# Patient Record
Sex: Male | Born: 1980 | Hispanic: No | Marital: Married | Smoking: Former smoker
Health system: Southern US, Community
[De-identification: ages and names within clinical notes are randomized; demographics above are authoritative.]

## PROBLEM LIST (undated history)

## (undated) DIAGNOSIS — F1010 Alcohol abuse, uncomplicated: Secondary | ICD-10-CM

## (undated) DIAGNOSIS — R569 Unspecified convulsions: Secondary | ICD-10-CM

---

## 2015-06-17 ENCOUNTER — Encounter (HOSPITAL_COMMUNITY): Payer: Self-pay | Admitting: Emergency Medicine

## 2015-06-17 ENCOUNTER — Inpatient Hospital Stay (HOSPITAL_COMMUNITY)
Admission: EM | Admit: 2015-06-17 | Discharge: 2015-06-22 | DRG: 896 | Disposition: A | Discharge status: Home / Self Care | Payer: Self-pay | Attending: Internal Medicine | Admitting: Internal Medicine

## 2015-06-17 DIAGNOSIS — E876 Hypokalemia: Secondary | ICD-10-CM | POA: Diagnosis present

## 2015-06-17 DIAGNOSIS — R339 Retention of urine, unspecified: Secondary | ICD-10-CM | POA: Diagnosis present

## 2015-06-17 DIAGNOSIS — F10231 Alcohol dependence with withdrawal delirium: Principal | ICD-10-CM | POA: Diagnosis present

## 2015-06-17 DIAGNOSIS — Y900 Blood alcohol level of less than 20 mg/100 ml: Secondary | ICD-10-CM | POA: Diagnosis present

## 2015-06-17 DIAGNOSIS — D7589 Other specified diseases of blood and blood-forming organs: Secondary | ICD-10-CM | POA: Diagnosis present

## 2015-06-17 DIAGNOSIS — F1010 Alcohol abuse, uncomplicated: Secondary | ICD-10-CM | POA: Diagnosis present

## 2015-06-17 DIAGNOSIS — F10239 Alcohol dependence with withdrawal, unspecified: Secondary | ICD-10-CM | POA: Diagnosis present

## 2015-06-17 DIAGNOSIS — R739 Hyperglycemia, unspecified: Secondary | ICD-10-CM | POA: Diagnosis present

## 2015-06-17 DIAGNOSIS — R7301 Impaired fasting glucose: Secondary | ICD-10-CM | POA: Diagnosis present

## 2015-06-17 DIAGNOSIS — T510X1A Toxic effect of ethanol, accidental (unintentional), initial encounter: Secondary | ICD-10-CM | POA: Diagnosis present

## 2015-06-17 DIAGNOSIS — F10931 Alcohol use, unspecified with withdrawal delirium: Secondary | ICD-10-CM

## 2015-06-17 DIAGNOSIS — Z79899 Other long term (current) drug therapy: Secondary | ICD-10-CM

## 2015-06-17 DIAGNOSIS — F10939 Alcohol use, unspecified with withdrawal, unspecified: Secondary | ICD-10-CM | POA: Diagnosis present

## 2015-06-17 DIAGNOSIS — R441 Visual hallucinations: Secondary | ICD-10-CM | POA: Diagnosis present

## 2015-06-17 DIAGNOSIS — F17210 Nicotine dependence, cigarettes, uncomplicated: Secondary | ICD-10-CM | POA: Diagnosis present

## 2015-06-17 DIAGNOSIS — F17200 Nicotine dependence, unspecified, uncomplicated: Secondary | ICD-10-CM | POA: Diagnosis present

## 2015-06-17 DIAGNOSIS — G9340 Encephalopathy, unspecified: Secondary | ICD-10-CM | POA: Diagnosis present

## 2015-06-17 DIAGNOSIS — IMO0001 Reserved for inherently not codable concepts without codable children: Secondary | ICD-10-CM | POA: Diagnosis present

## 2015-06-17 DIAGNOSIS — R030 Elevated blood-pressure reading, without diagnosis of hypertension: Secondary | ICD-10-CM | POA: Diagnosis present

## 2015-06-17 HISTORY — DX: Alcohol abuse, uncomplicated: F10.10

## 2015-06-17 LAB — CBC
HCT: 45.1 % (ref 39.0–52.0)
HEMOGLOBIN: 16.2 g/dL (ref 13.0–17.0)
MCH: 36.2 pg — ABNORMAL HIGH (ref 26.0–34.0)
MCHC: 35.9 g/dL (ref 30.0–36.0)
MCV: 100.9 fL — ABNORMAL HIGH (ref 78.0–100.0)
PLATELETS: 103 10*3/uL — AB (ref 150–400)
RBC: 4.47 MIL/uL (ref 4.22–5.81)
RDW: 12.5 % (ref 11.5–15.5)
WBC: 9.8 10*3/uL (ref 4.0–10.5)

## 2015-06-17 LAB — COMPREHENSIVE METABOLIC PANEL
ALBUMIN: 4.5 g/dL (ref 3.5–5.0)
ALT: 58 U/L (ref 17–63)
ANION GAP: 8 (ref 5–15)
AST: 79 U/L — ABNORMAL HIGH (ref 15–41)
Alkaline Phosphatase: 55 U/L (ref 38–126)
BUN: <5 mg/dL — ABNORMAL LOW (ref 6–20)
CO2: 27 mmol/L (ref 22–32)
Calcium: 9.8 mg/dL (ref 8.9–10.3)
Chloride: 102 mmol/L (ref 101–111)
Creatinine, Ser: 0.73 mg/dL (ref 0.61–1.24)
GFR calc Af Amer: >60 mL/min (ref >60)
GFR calc non Af Amer: >60 mL/min (ref >60)
GLUCOSE: 143 mg/dL — AB (ref 65–99)
POTASSIUM: 3.8 mmol/L (ref 3.5–5.1)
SODIUM: 137 mmol/L (ref 135–145)
Total Bilirubin: 0.9 mg/dL (ref 0.3–1.2)
Total Protein: 8.2 g/dL — ABNORMAL HIGH (ref 6.5–8.1)

## 2015-06-17 LAB — PROTIME-INR
INR: 1.2 (ref 0.00–1.49)
Prothrombin Time: 15.3 seconds — ABNORMAL HIGH (ref 11.6–15.2)

## 2015-06-17 LAB — MRSA PCR SCREENING: MRSA BY PCR: NEGATIVE

## 2015-06-17 LAB — I-STAT CG4 LACTIC ACID, ED
LACTIC ACID, VENOUS: 1.08 mmol/L (ref 0.5–2.0)
Lactic Acid, Venous: 0.73 mmol/L (ref 0.5–2.0)

## 2015-06-17 LAB — ETHANOL: Alcohol, Ethyl (B): <5 mg/dL (ref <5)

## 2015-06-17 MED ORDER — SODIUM CHLORIDE 0.9 % IV SOLN
INTRAVENOUS | Status: DC
  Administered 2015-06-17 – 2015-06-18 (×2): via INTRAVENOUS

## 2015-06-17 MED ORDER — MIDAZOLAM HCL 2 MG/2ML IJ SOLN
INTRAMUSCULAR | Status: AC
  Administered 2015-06-17: 5 mg via INTRAVENOUS
  Filled 2015-06-17: qty 6

## 2015-06-17 MED ORDER — LORAZEPAM 2 MG/ML IJ SOLN
2.0000 mg | Freq: Once | INTRAMUSCULAR | Status: AC
  Administered 2015-06-17: 2 mg via INTRAVENOUS
  Filled 2015-06-17: qty 1

## 2015-06-17 MED ORDER — FOLIC ACID 5 MG/ML IJ SOLN
1.0000 mg | Freq: Every day | INTRAMUSCULAR | Status: DC
  Administered 2015-06-17 – 2015-06-19 (×2): 1 mg via INTRAVENOUS
  Filled 2015-06-17 (×4): qty 0.2

## 2015-06-17 MED ORDER — ENOXAPARIN SODIUM 40 MG/0.4ML ~~LOC~~ SOLN
40.0000 mg | SUBCUTANEOUS | Status: DC
Start: 2015-06-17 — End: 2015-06-22
  Administered 2015-06-17 – 2015-06-21 (×5): 40 mg via SUBCUTANEOUS
  Filled 2015-06-17 (×6): qty 0.4

## 2015-06-17 MED ORDER — LORAZEPAM 1 MG PO TABS: 0.0000 mg | ORAL_TABLET | Freq: Two times a day (BID) | ORAL | Status: DC

## 2015-06-17 MED ORDER — MIDAZOLAM HCL 2 MG/2ML IJ SOLN: 5.0000 mg | Freq: Once | INTRAMUSCULAR | Status: DC

## 2015-06-17 MED ORDER — THIAMINE HCL 100 MG/ML IJ SOLN
100.0000 mg | Freq: Every day | INTRAMUSCULAR | Status: DC
  Administered 2015-06-17 – 2015-06-18 (×2): 100 mg via INTRAVENOUS
  Filled 2015-06-17 (×2): qty 2
  Filled 2015-06-17: qty 1

## 2015-06-17 MED ORDER — LORAZEPAM 2 MG/ML IJ SOLN
0.0000 mg | Freq: Four times a day (QID) | INTRAMUSCULAR | Status: DC
  Administered 2015-06-17: 4 mg via INTRAVENOUS
  Filled 2015-06-17: qty 2

## 2015-06-17 MED ORDER — SODIUM CHLORIDE 0.9 % IV BOLUS (SEPSIS)
1000.0000 mL | Freq: Once | INTRAVENOUS | Status: AC
  Administered 2015-06-17: 1000 mL via INTRAVENOUS

## 2015-06-17 MED ORDER — DIAZEPAM 5 MG/ML IJ SOLN
5.0000 mg | INTRAMUSCULAR | Status: DC | PRN
  Administered 2015-06-17 (×4): 5 mg via INTRAVENOUS
  Administered 2015-06-18 (×4): 10 mg via INTRAVENOUS
  Administered 2015-06-18: 5 mg via INTRAVENOUS
  Administered 2015-06-18 (×2): 10 mg via INTRAVENOUS
  Filled 2015-06-17 (×10): qty 2

## 2015-06-17 MED ORDER — ADULT MULTIVITAMIN W/MINERALS CH
1.0000 | ORAL_TABLET | Freq: Every day | ORAL | Status: DC
  Administered 2015-06-19: 1 via ORAL
  Filled 2015-06-17 (×2): qty 1

## 2015-06-17 MED ORDER — MIDAZOLAM HCL 2 MG/2ML IJ SOLN
5.0000 mg | Freq: Once | INTRAMUSCULAR | Status: AC
  Administered 2015-06-17: 5 mg via INTRAVENOUS

## 2015-06-17 MED ORDER — VITAMIN B-1 100 MG PO TABS
100.0000 mg | ORAL_TABLET | Freq: Every day | ORAL | Status: DC
  Administered 2015-06-19 – 2015-06-22 (×4): 100 mg via ORAL
  Filled 2015-06-17 (×5): qty 1

## 2015-06-17 MED ORDER — DIAZEPAM 5 MG/ML IJ SOLN
5.0000 mg | Freq: Once | INTRAMUSCULAR | Status: DC
  Filled 2015-06-17: qty 2

## 2015-06-17 MED ORDER — LORAZEPAM 2 MG/ML IJ SOLN: 0.0000 mg | Freq: Two times a day (BID) | INTRAMUSCULAR | Status: DC

## 2015-06-17 MED ORDER — LORAZEPAM 1 MG PO TABS: 0.0000 mg | ORAL_TABLET | Freq: Four times a day (QID) | ORAL | Status: DC

## 2015-06-17 NOTE — ED Provider Notes (Signed)
CSN: [id number]     Arrival date & time 06/17/15  1313 History   First MD Initiated Contact with Patient 06/17/15 1345     Chief Complaint  Patient presents with  . Delirium Tremens (DTS)     (Consider location/radiation/quality/duration/timing/severity/associated sxs/prior Treatment) HPI Comments: Stopped drinking recently. Last beer 2 days ago. Wife here states he's been talking to people that aren't here.   Patient is a 34 y.o. male presenting with alcohol problem. The history is provided by the patient.  Alcohol Problem This is a new problem. The current episode started more than 1 week ago. The problem occurs constantly. The problem has not changed since onset.Pertinent negatives include no abdominal pain and no shortness of breath. Nothing aggravates the symptoms. Nothing relieves the symptoms.    Past Medical History  Diagnosis Date  . ETOH abuse    History reviewed. No pertinent past surgical history. History reviewed. No pertinent family history. Social History  Substance Use Topics  . Smoking status: Current Every Day Smoker  . Smokeless tobacco: None  . Alcohol Use: Yes    Review of Systems  Constitutional: Negative for fever.  Respiratory: Negative for cough and shortness of breath.   Gastrointestinal: Negative for vomiting and abdominal pain.  All other systems reviewed and are negative.     Allergies  Review of patient's allergies indicates no known allergies.  Home Medications   Prior to Admission medications   Not on File   BP 161/114 mmHg  Pulse 125  Temp(Src) 99.8 F (37.7 C) (Oral)  Resp 20  SpO2 99% Physical Exam  Constitutional: He is oriented to person, place, and time. He appears well-developed and well-nourished. No distress.  HENT:  Head: Normocephalic and atraumatic.  Mouth/Throat: No oropharyngeal exudate.  Eyes: EOM are normal. Pupils are equal, round, and reactive to light.  Neck: Normal range of motion. Neck supple.   Cardiovascular: Normal rate and regular rhythm.  Exam reveals no friction rub.   No murmur heard. Pulmonary/Chest: Effort normal and breath sounds normal. No respiratory distress. He has no wheezes. He has no rales.  Abdominal: He exhibits no distension. There is no tenderness. There is no rebound.  Musculoskeletal: Normal range of motion. He exhibits no edema.  Neurological: He is alert and oriented to person, place, and time.  Skin: He is not diaphoretic.  Nursing note and vitals reviewed.   ED Course  Procedures (including critical care time) Labs Review Labs Reviewed  CBC  COMPREHENSIVE METABOLIC PANEL  ETHANOL  URINALYSIS, ROUTINE W REFLEX MICROSCOPIC (NOT AT [facility])  PROTIME-INR  I-STAT CG4 LACTIC ACID, ED    Imaging Review No results found. I have personally reviewed and evaluated these images and lab results as part of my medical decision-making.   EKG Interpretation None      MDM   Final diagnoses:  Delirium tremens    34 year old male here for concern of delirium tremens. He has a chronic drinker and has tapered off his drinking over the past 4 days. He is now having auditory and visual hallucinations. In triage she was talking to his pants and other people in the room. He tremulous, tachycardic. We'll place on CIWA protocol and anticipate admission for his delirium tremens.    Elwin Mocha, MD 06/17/15 [phone]

## 2015-06-17 NOTE — ED Notes (Signed)
Attempted report x 2 

## 2015-06-17 NOTE — ED Notes (Signed)
Per family pt quit drinking ETOH on Saturday and now having auditory and visual hallucinations; pt is speaking to his pants currently and twitching and scratching at legs; pt unable to follow commands at present due to distraction; pt with hx of drinking 18-24 beers a day
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
## 2015-06-17 NOTE — ED Notes (Signed)
Pt is now sleeping with even and unlabored respirations.  Pt remains on NRB.

## 2015-06-17 NOTE — ED Notes (Signed)
Patient becoming more agitated and restless.  Pulling lines, actively hallucinating.  Patient becoming more difficult to redirect.  Dr. Gwendolyn Grant notified, see orders for versed.

## 2015-06-17 NOTE — ED Notes (Signed)
Attempted to call report to floor. RN unavailable and will call back. 

## 2015-06-18 DIAGNOSIS — F10231 Alcohol dependence with withdrawal delirium: Principal | ICD-10-CM

## 2015-06-18 DIAGNOSIS — E876 Hypokalemia: Secondary | ICD-10-CM

## 2015-06-18 LAB — BASIC METABOLIC PANEL
Anion gap: 8 (ref 5–15)
BUN: <5 mg/dL — ABNORMAL LOW (ref 6–20)
CHLORIDE: 102 mmol/L (ref 101–111)
CO2: 28 mmol/L (ref 22–32)
CREATININE: 0.64 mg/dL (ref 0.61–1.24)
Calcium: 8.8 mg/dL — ABNORMAL LOW (ref 8.9–10.3)
GFR calc non Af Amer: >60 mL/min (ref >60)
GLUCOSE: 98 mg/dL (ref 65–99)
Potassium: 3.2 mmol/L — ABNORMAL LOW (ref 3.5–5.1)
Sodium: 138 mmol/L (ref 135–145)

## 2015-06-18 LAB — CBC
HCT: 41.5 % (ref 39.0–52.0)
Hemoglobin: 14.8 g/dL (ref 13.0–17.0)
MCH: 36 pg — AB (ref 26.0–34.0)
MCHC: 35.7 g/dL (ref 30.0–36.0)
MCV: 101 fL — AB (ref 78.0–100.0)
Platelets: 95 10*3/uL — ABNORMAL LOW (ref 150–400)
RBC: 4.11 MIL/uL — AB (ref 4.22–5.81)
RDW: 12.5 % (ref 11.5–15.5)
WBC: 5.9 10*3/uL (ref 4.0–10.5)

## 2015-06-18 LAB — URINALYSIS, ROUTINE W REFLEX MICROSCOPIC
BILIRUBIN URINE: NEGATIVE
Glucose, UA: NEGATIVE mg/dL
Hgb urine dipstick: NEGATIVE
KETONES UR: 40 mg/dL — AB
LEUKOCYTES UA: NEGATIVE
NITRITE: NEGATIVE
PH: 7.5 (ref 5.0–8.0)
Protein, ur: NEGATIVE mg/dL
SPECIFIC GRAVITY, URINE: 1.018 (ref 1.005–1.030)
UROBILINOGEN UA: 1 mg/dL (ref 0.0–1.0)

## 2015-06-18 MED ORDER — MIDAZOLAM HCL 2 MG/2ML IJ SOLN: 1.0000 mg | INTRAMUSCULAR | Status: DC | PRN

## 2015-06-18 MED ORDER — PNEUMOCOCCAL VAC POLYVALENT 25 MCG/0.5ML IJ INJ
0.5000 mL | INJECTION | INTRAMUSCULAR | Status: AC
  Administered 2015-06-19: 0.5 mL via INTRAMUSCULAR
  Filled 2015-06-18: qty 0.5

## 2015-06-18 MED ORDER — HYDRALAZINE HCL 20 MG/ML IJ SOLN
10.0000 mg | INTRAMUSCULAR | Status: DC | PRN
  Administered 2015-06-18: 10 mg via INTRAVENOUS
  Filled 2015-06-18: qty 1

## 2015-06-18 MED ORDER — DIAZEPAM 5 MG/ML IJ SOLN
15.0000 mg | Freq: Once | INTRAMUSCULAR | Status: AC
  Administered 2015-06-18: 15 mg via INTRAVENOUS
  Filled 2015-06-18: qty 4

## 2015-06-18 MED ORDER — DEXMEDETOMIDINE HCL IN NACL 200 MCG/50ML IV SOLN
0.2000 ug/kg/h | INTRAVENOUS | Status: AC
Start: 2015-06-18 — End: 2015-06-19
  Administered 2015-06-18: 0.2 ug/kg/h via INTRAVENOUS
  Administered 2015-06-18: 0.6 ug/kg/h via INTRAVENOUS
  Administered 2015-06-18: 0.7 ug/kg/h via INTRAVENOUS
  Administered 2015-06-18: 0.8 ug/kg/h via INTRAVENOUS
  Administered 2015-06-19: 0.5 ug/kg/h via INTRAVENOUS
  Filled 2015-06-18 (×6): qty 50

## 2015-06-18 MED ORDER — DIAZEPAM 5 MG/ML IJ SOLN
5.0000 mg | Freq: Once | INTRAMUSCULAR | Status: AC
  Administered 2015-06-18: 5 mg via INTRAVENOUS
  Filled 2015-06-18: qty 2

## 2015-06-18 MED ORDER — DEXTROSE IN LACTATED RINGERS 5 % IV SOLN
INTRAVENOUS | Status: DC
  Administered 2015-06-18: 16:00:00 via INTRAVENOUS
  Administered 2015-06-19: 1000 mL via INTRAVENOUS
  Administered 2015-06-19 – 2015-06-20 (×2): via INTRAVENOUS

## 2015-06-18 NOTE — Progress Notes (Signed)
eLink Physician-Brief Progress Note Patient Name: Ryan Greer DOB: [birth year] MRN: [id number]   Date of Service  06/18/2015  HPI/Events of Note  htn Hr 50  eICU Interventions  hyral May add clonidine patch     Intervention Category Intermediate Interventions: Hypertension - evaluation and management  Nelda Bucks. 06/18/2015, 5:58 PM

## 2015-06-18 NOTE — Care Management Note (Signed)
Case Management Note  Patient Details  Name: Ryan Greer MRN: [id number] Date of Birth: [birth year]  Subjective/Objective:    DT's - CWA 26.  Placed consult for ETOH counseling when able.                 Action/Plan:   Expected Discharge Date:                  Expected Discharge Plan:  Home/Self Care  In-House Referral:  Clinical Social Work  Discharge planning Services  CM Consult  Post Acute Care Choice:    Choice offered to:     DME Arranged:    DME Agency:     HH Arranged:    HH Agency:     Status of Service:  In process, will continue to follow  Medicare Important Message Given:    Date Medicare IM Given:    Medicare IM give by:    Date Additional Medicare IM Given:    Additional Medicare Important Message give by:     If discussed at Long Length of Stay Meetings, dates discussed:    Additional Comments:  Vangie Bicker, RN 06/18/2015, 10:46 AM

## 2015-06-18 NOTE — Progress Notes (Signed)
Utilization Review Completed.Dowell, Deborah T9/30/2016  

## 2015-06-18 NOTE — Progress Notes (Signed)
Pt's CIWAs consistently in 41s, notified IMTS resident; MD to call back w/ potential transfer orders for precedex drip. Will continue to monitor.

## 2015-06-18 NOTE — Progress Notes (Signed)
Subjective: Patient not well controlled on CIWA protocol with  valium given since 9/29 evening. Continued with tremors, hallucinations, and paradoxical sweats. PCCM was consulted and patient transferred for treatment with precedex drip.  Objective: Vital signs in last 24 hours: Filed Vitals:   06/18/15 1300 06/18/15 1418 06/18/15 1423 06/18/15 1500  BP: 145/104 122/86  164/95  Pulse: 77   63  Temp:  97.9 F (36.6 C)    TempSrc:  Axillary    Resp: Height:      Weight:      SpO2: 97% 90% 94% 92%   Weight change:   Intake/Output Summary (Last 24 hours) at 06/18/15 1615 Last data filed at 06/18/15 1323  Gross per 24 hour  Intake 791.07 ml  Output   1650 ml  Net -858.93 ml   GENERAL- alert, co-operative, NAD HEENT- Atraumatic, PERRL, oral mucosa appears moist CARDIAC- RRR, no murmurs, rubs or gallops. RESP- CTAB, no wheezes or crackles. ABDOMEN- Soft, nontender, no guarding or rebound, normoactive bowel sounds present PSYCH- patient very agitated, disoriented, intermittent gesturing towards empty area of the room  Lab Results: Basic Metabolic Panel:  Recent Labs Lab 06/17/15 1404 06/18/15 0335  NA 137 138  K 3.8 3.2*  CL 102 102  CO2 27 28  GLUCOSE 143* 98  BUN <5* <5*  CREATININE 0.73 0.64  CALCIUM 9.8 8.8*   Liver Function Tests:  Recent Labs Lab 06/17/15 1404  AST 79*  ALT 58  ALKPHOS 55  BILITOT 0.9  PROT 8.2*  ALBUMIN 4.5   No results for input(s): LIPASE, AMYLASE in the last 168 hours. No results for input(s): AMMONIA in the last 168 hours. CBC:  Recent Labs Lab 06/17/15 1404 06/18/15 0335  WBC 9.8 5.9  HGB 16.2 14.8  HCT 45.1 41.5  MCV 100.9* 101.0*  PLT 103* 95*   Cardiac Enzymes: No results for input(s): CKTOTAL, CKMB, CKMBINDEX, TROPONINI in the last 168 hours. BNP: No results for input(s): PROBNP in the last 168 hours. D-Dimer: No results for input(s): DDIMER in the last 168 hours. CBG: No results for input(s):  GLUCAP in the last 168 hours. Hemoglobin A1C: No results for input(s): HGBA1C in the last 168 hours. Fasting Lipid Panel: No results for input(s): CHOL, HDL, LDLCALC, TRIG, CHOLHDL, LDLDIRECT in the last 168 hours. Thyroid Function Tests: No results for input(s): TSH, T4TOTAL, FREET4, T3FREE, THYROIDAB in the last 168 hours. Coagulation:  Recent Labs Lab 06/17/15 1404  LABPROT 15.3*  INR 1.20   Anemia Panel: No results for input(s): VITAMINB12, FOLATE, FERRITIN, TIBC, IRON, RETICCTPCT in the last 168 hours. Urine Drug Screen: Drugs of Abuse  No results found for: LABOPIA, COCAINSCRNUR, LABBENZ, AMPHETMU, THCU, LABBARB  Alcohol Level:  Recent Labs Lab 06/17/15 1404  ETH <5   Urinalysis:  Recent Labs Lab 06/18/15 1206  COLORURINE YELLOW  LABSPEC 1.018  PHURINE 7.5  GLUCOSEU NEGATIVE  HGBUR NEGATIVE  BILIRUBINUR NEGATIVE  KETONESUR 40*  PROTEINUR NEGATIVE  UROBILINOGEN 1.0  NITRITE NEGATIVE  LEUKOCYTESUR NEGATIVE    Micro Results: Recent Results (from the past 240 hour(s))  MRSA PCR Screening     Status: None   Collection Time: 06/17/15  5:59 PM  Result Value Ref Range Status   MRSA by PCR NEGATIVE NEGATIVE Final    Comment:        The GeneXpert MRSA Assay (FDA approved for NASAL specimens only), is one component of a comprehensive MRSA colonization surveillance program. It is not  intended to diagnose MRSA infection nor to guide or monitor treatment for MRSA infections.    Studies/Results: No results found. Medications: I have reviewed the patient's current medications. Scheduled Meds: . enoxaparin (LOVENOX) injection  40 mg Subcutaneous Q24H  . folic acid  1 mg Intravenous Daily  . multivitamin with minerals  1 tablet Oral Daily  . [START ON 06/19/2015] pneumococcal 23 valent vaccine  0.5 mL Intramuscular Tomorrow-1000  . thiamine  100 mg Oral Daily   Or  . thiamine  100 mg Intravenous Daily   Continuous Infusions: . dexmedetomidine 0.8  mcg/kg/hr (06/18/15 1512)  . dextrose 5% lactated ringers 75 mL/hr at 06/18/15 1542   PRN Meds:.midazolam Assessment/Plan: Delirium tremens Symptoms not controlled on high dose of valium. Did not tolerate ativan, had no improvement in ED. Plan today is transfer to [facility] service as he is not adequately treated on CIWA protocol in stepdown unit.. - CIWA protocol with diazepam IV when necessary - Multivitamin, Thiamine, Folic acid - cardiac monitoring - Transfer to ICU  Diet: NPO DVT ppx: [place] lovenox FULL CODE  Dispo: Disposition is deferred at this time, awaiting improvement of current medical problems.    LOS: 1 day   Fuller Plan, MD 06/18/2015, 4:15 PM

## 2015-06-18 NOTE — Progress Notes (Signed)
Pt increasingly more agitated, trying to crawl out of bed. MD notified, PRN meds given. Will continue to monitor.
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
## 2015-06-18 NOTE — Progress Notes (Signed)
Pt continues to pull at equipment and attempt to get out of bed. Attempted to reorient patient throughout shift without success. Telephone order received for posey belt. Will apply and continue to monitor.
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
## 2015-06-18 NOTE — Progress Notes (Signed)
MD to bedside, eLink notified of pt's increased agitation. Pt tachycardic and tachypnic; VS return to [facility] and agitation decreased. eLink aware and will keep watch; no transfer orders at this time. Will continue to monitor pt.

## 2015-06-19 LAB — MAGNESIUM: MAGNESIUM: 1.8 mg/dL (ref 1.7–2.4)

## 2015-06-19 LAB — PHOSPHORUS: Phosphorus: 4 mg/dL (ref 2.5–4.6)

## 2015-06-19 MED ORDER — DEXMEDETOMIDINE HCL IN NACL 200 MCG/50ML IV SOLN
0.2000 ug/kg/h | INTRAVENOUS | Status: DC
  Administered 2015-06-19: 0.4 ug/kg/h via INTRAVENOUS
  Administered 2015-06-20: 0.3 ug/kg/h via INTRAVENOUS
  Filled 2015-06-19 (×2): qty 50

## 2015-06-19 NOTE — Progress Notes (Signed)
eLink Physician-Brief Progress Note Patient Name: Ryan Greer DOB: 1980-10-15 MRN: [id number]   Date of Service  06/19/2015  HPI/Events of Note  Urinary retention with greater than 1 liter in bladder on scan.  eICU Interventions  Foley to straight drain     Intervention Category Intermediate Interventions: Other:  DETERDING,ELIZABETH 06/19/2015, 2:15 AM
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
## 2015-06-20 LAB — MAGNESIUM: Magnesium: 1.7 mg/dL (ref 1.7–2.4)

## 2015-06-20 LAB — PHOSPHORUS: Phosphorus: 4.6 mg/dL (ref 2.5–4.6)

## 2015-06-20 MED ORDER — THIAMINE HCL 100 MG/ML IJ SOLN: 100.0000 mg | Freq: Every day | INTRAMUSCULAR | Status: DC

## 2015-06-20 MED ORDER — FOLIC ACID 1 MG PO TABS
1.0000 mg | ORAL_TABLET | Freq: Every day | ORAL | Status: DC
  Administered 2015-06-20 – 2015-06-22 (×3): 1 mg via ORAL
  Filled 2015-06-20 (×3): qty 1

## 2015-06-20 MED ORDER — LORAZEPAM 2 MG/ML IJ SOLN
1.0000 mg | Freq: Four times a day (QID) | INTRAMUSCULAR | Status: DC | PRN
  Administered 2015-06-20: 1 mg via INTRAVENOUS
  Filled 2015-06-20: qty 1

## 2015-06-20 MED ORDER — LORAZEPAM 1 MG PO TABS: 1.0000 mg | ORAL_TABLET | Freq: Four times a day (QID) | ORAL | Status: DC | PRN

## 2015-06-20 MED ORDER — VITAMIN B-1 100 MG PO TABS: 100.0000 mg | ORAL_TABLET | Freq: Every day | ORAL | Status: DC

## 2015-06-20 MED ORDER — ADULT MULTIVITAMIN W/MINERALS CH
1.0000 | ORAL_TABLET | Freq: Every day | ORAL | Status: DC
  Administered 2015-06-20 – 2015-06-22 (×3): 1 via ORAL
  Filled 2015-06-20 (×3): qty 1

## 2015-06-20 MED ORDER — FOLIC ACID 1 MG PO TABS
1.0000 mg | ORAL_TABLET | Freq: Every day | ORAL | Status: DC
Start: 2015-06-20 — End: 2015-06-20

## 2015-06-20 MED ORDER — MAGNESIUM SULFATE 2 GM/50ML IV SOLN
2.0000 g | Freq: Once | INTRAVENOUS | Status: AC
  Administered 2015-06-20: 2 g via INTRAVENOUS
  Filled 2015-06-20: qty 50
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
## 2015-06-21 DIAGNOSIS — E876 Hypokalemia: Secondary | ICD-10-CM | POA: Diagnosis present

## 2015-06-21 DIAGNOSIS — R030 Elevated blood-pressure reading, without diagnosis of hypertension: Secondary | ICD-10-CM

## 2015-06-21 DIAGNOSIS — IMO0001 Reserved for inherently not codable concepts without codable children: Secondary | ICD-10-CM | POA: Diagnosis present

## 2015-06-21 DIAGNOSIS — R7301 Impaired fasting glucose: Secondary | ICD-10-CM

## 2015-06-21 LAB — GLUCOSE, CAPILLARY
GLUCOSE-CAPILLARY: 111 mg/dL — AB (ref 65–99)
Glucose-Capillary: 93 mg/dL (ref 65–99)
Glucose-Capillary: 138 mg/dL — ABNORMAL HIGH (ref 65–99)

## 2015-06-21 LAB — COMPREHENSIVE METABOLIC PANEL
ALBUMIN: 3.5 g/dL (ref 3.5–5.0)
ALK PHOS: 41 U/L (ref 38–126)
ALT: 33 U/L (ref 17–63)
AST: 24 U/L (ref 15–41)
Anion gap: 9 (ref 5–15)
BILIRUBIN TOTAL: 0.7 mg/dL (ref 0.3–1.2)
CALCIUM: 9.2 mg/dL (ref 8.9–10.3)
CO2: 29 mmol/L (ref 22–32)
Chloride: 101 mmol/L (ref 101–111)
Creatinine, Ser: 0.57 mg/dL — ABNORMAL LOW (ref 0.61–1.24)
GFR calc Af Amer: >60 mL/min (ref >60)
GFR calc non Af Amer: >60 mL/min (ref >60)
GLUCOSE: 110 mg/dL — AB (ref 65–99)
Potassium: 3.4 mmol/L — ABNORMAL LOW (ref 3.5–5.1)
SODIUM: 139 mmol/L (ref 135–145)
TOTAL PROTEIN: 6.2 g/dL — AB (ref 6.5–8.1)

## 2015-06-21 LAB — CBC WITH DIFFERENTIAL/PLATELET
BASOS PCT: 1 %
Basophils Absolute: 0.1 10*3/uL (ref 0.0–0.1)
EOS ABS: 0.1 10*3/uL (ref 0.0–0.7)
EOS PCT: 2 %
HEMATOCRIT: 42.4 % (ref 39.0–52.0)
Hemoglobin: 15.4 g/dL (ref 13.0–17.0)
Lymphocytes Relative: 21 %
Lymphs Abs: 1.3 10*3/uL (ref 0.7–4.0)
MCH: 36 pg — ABNORMAL HIGH (ref 26.0–34.0)
MCHC: 36.3 g/dL — AB (ref 30.0–36.0)
MCV: 99.1 fL (ref 78.0–100.0)
MONO ABS: 1.5 10*3/uL — AB (ref 0.1–1.0)
MONOS PCT: 25 %
Neutro Abs: 3.1 10*3/uL (ref 1.7–7.7)
Neutrophils Relative %: 51 %
PLATELETS: 154 10*3/uL (ref 150–400)
RBC: 4.28 MIL/uL (ref 4.22–5.81)
RDW: 12.2 % (ref 11.5–15.5)
WBC: 6.1 10*3/uL (ref 4.0–10.5)

## 2015-06-21 LAB — MAGNESIUM: MAGNESIUM: 1.9 mg/dL (ref 1.7–2.4)

## 2015-06-21 MED ORDER — POTASSIUM CHLORIDE CRYS ER 20 MEQ PO TBCR
40.0000 meq | EXTENDED_RELEASE_TABLET | Freq: Once | ORAL | Status: AC
  Administered 2015-06-21: 40 meq via ORAL
  Filled 2015-06-21: qty 2

## 2015-06-21 NOTE — Progress Notes (Signed)
TRANSFER ACCEPTING NOTE  Ryan Greer is 34 year old man with past medical history of alcohol abuse (20 beers daily for at least the past 3 years) who presented on 06/17/15 to IMTS with delirium tremens and witnessed LOC per wife after not drinking alcohol for three days prior to admission. Ethanol level was undetectable on admission. He was placed on CIWA protocol with valium however due to consistently high CIWA's in the 20's requiring total 90 mg of valium and persistent agitation with tachycardia and tachypnea he required transfer to ICU on 9/30 for administration of precedex drip which he received until 10/2 with resolution of delirium tremens and agitation.    Pt seen and examined this morning. No acute events overnight. He denies any complaints.    Objective: Vital signs in last 24 hours: Filed Vitals:   06/20/15 1202 06/20/15 1419 06/20/15 2100 06/21/15 0500  BP:  138/85 156/99 157/101  Pulse:  89 107 86  Temp: 98.7 F (37.1 C) 99.3 F (37.4 C) 98.5 F (36.9 C) 98.6 F (37 C)  TempSrc: Oral Oral Oral Oral  Resp:  Height:      Weight:   166 lb 12.8 oz (75.66 kg)   SpO2:  100% 100% 100%   Weight change:   Intake/Output Summary (Last 24 hours) at 06/21/15 1610 Last data filed at 06/20/15 1200  Gross per 24 hour  Intake    770 ml  Output   1075 ml  Net   -305 ml   PHYSICAL EXAMINATION:  General: NAD, resting in bed Heart: Normal rate and rhythm with no murmur  Lungs: Clear to ausculation bilaterally,  no ronchi, rales, or wheezing.  Abdomen: Soft, non-tender, non-distended, normal BS Extremities: No edema  Neuro: A & O x 3, no tremors    Lab Results: Basic Metabolic Panel:  Recent Labs Lab 06/18/15 0335  06/19/15 1105 06/20/15 0208 06/21/15 0455  NA 138  --   --   --  139  K 3.2*  --   --   --  3.4*  CL 102  --   --   --  101  CO2 28  --   --   --  29  GLUCOSE 98  --   --   --  110*  BUN <5*  --   --    --  <5*  CREATININE 0.64  --   --   --  0.57*  CALCIUM 8.8*  --   --   --  9.2  MG  --   < > 1.8 1.7 1.9  PHOS  --   --  4.0 4.6  --   < > = values in this interval not displayed. Liver Function Tests:  Recent Labs Lab 06/17/15 1404 06/21/15 0455  AST 79* 24  ALT 58 33  ALKPHOS 55 41  BILITOT 0.9 0.7  PROT 8.2* 6.2*  ALBUMIN 4.5 3.5   CBC:  Recent Labs Lab 06/18/15 0335 06/21/15 0455  WBC 5.9 6.1  NEUTROABS  --  3.1  HGB 14.8 15.4  HCT 41.5 42.4  MCV 101.0* 99.1  PLT 95* 154  Coagulation:  Recent Labs Lab 06/17/15 1404  LABPROT 15.3*  INR 1.20   Alcohol Level:  Recent Labs Lab 06/17/15 1404  ETH <5   Urinalysis:  Recent Labs Lab 06/18/15 1206  COLORURINE YELLOW  LABSPEC 1.018  PHURINE 7.5  GLUCOSEU NEGATIVE  HGBUR NEGATIVE  BILIRUBINUR NEGATIVE  KETONESUR 40*  PROTEINUR NEGATIVE  UROBILINOGEN 1.0  NITRITE NEGATIVE  LEUKOCYTESUR NEGATIVE    Micro Results: Recent Results (from the past 240 hour(s))  MRSA PCR Screening     Status: None   Collection Time: 06/17/15  5:59 PM  Result Value Ref Range Status   MRSA by PCR NEGATIVE NEGATIVE Final    Comment:        The GeneXpert MRSA Assay (FDA approved for NASAL specimens only), is one component of a comprehensive MRSA colonization surveillance program. It is not intended to diagnose MRSA infection nor to guide or monitor treatment for MRSA infections.    Studies/Results: No results found. Medications: I have reviewed the patient's current medications. Scheduled Meds: . enoxaparin (LOVENOX) injection  40 mg Subcutaneous Q24H  . folic acid  1 mg Oral Daily  . multivitamin with minerals  1 tablet Oral Daily  . thiamine  100 mg Oral Daily   Continuous Infusions:  PRN Meds:.LORazepam **OR** LORazepam Assessment/Plan:  Delirium tremens in setting of alcohol abuse - Resolved. Pt with CIWA score overnight of 0-1 requiring 1 mg of Ativan administration last night. -Continue CIWA protocol    -Continue thiamine 100 mg daily, folic acid 1 mg daily, and multivitamin daily  -Pt counseled on alcohol cessation  -Consult SW and CM   Hypokalemia - K 3.4 this AM with Mag 1.9 in setting of alcohol abuse and decreased dietary intake.  -Replete with kdur 40 mEq x 1  -Monitor BMP and Mg   Elevated Blood Pressure - BP 157/101 with no prior history of hypertension.  -BP not at goal <140/90  -Continue to monitor   Elevated fasting blood sugar - BG 110 this AM with no prior history of DM.  -Consider obtaining A1c if fasting BG >126    HIV Screening - Pt with no prior screening. -Follow HV Ab   Diet: Regular DVT Ppx: Lovenox   Code: Full   Dispo: Disposition is deferred at this time, awaiting improvement of current medical problems.  Anticipated discharge in approximately 0-1 day(s).   The patient does not have a current PCP (No primary care provider on file.) and does need an [facility] hospital follow-up appointment after discharge.  The patient does not have transportation limitations that hinder transportation to clinic appointments.  .Services Needed at time of discharge: Y = Yes, Blank = No PT:   OT:   RN:   Equipment:   Other:     LOS: 4 days   Otis Brace, MD 06/21/2015, 7:28 AM

## 2015-06-21 NOTE — Care Management Note (Signed)
Case Management Note  Patient Details  Name: Ryan Greer MRN: [id number] Date of Birth: December 20, 1980  Subjective/Objective:      CM following for progression and d/c planning.              Action/Plan: 06/21/2015 Noted that pt needs PCP, also noted that pt has [place] address, eligibility appointment scheduled at [facility] for  Tuesday, Jun 29, 2015 2 10:30am. Will assist with meds if appropriate. Expected Discharge Date:    06/22/2015              Expected Discharge Plan:  Home/Self Care  In-House Referral:  Clinical Social Work  Discharge planning Services  CM Consult, Indigent Health Clinic  Post Acute Care Choice:  NA Choice offered to:     DME Arranged:    DME Agency:     HH Arranged:    HH Agency:     Status of Service:  Completed, signed off  Medicare Important Message Given:    Date Medicare IM Given:    Medicare IM give by:    Date Additional Medicare IM Given:    Additional Medicare Important Message give by:     If discussed at Long Length of Stay Meetings, dates discussed:    Additional Comments:  Starlyn Skeans, RN 06/21/2015, 4:05 PM

## 2015-06-21 NOTE — Evaluation (Signed)
Physical Therapy Evaluation Patient Details Name: Ryan Greer MRN: [id number] DOB: 1981/02/11 Today's Date: 06/21/2015   History of Present Illness  [place] Francisco is 34 year old man with past medical history of alcohol abuse (20 beers daily for at least the past 3 years) who presented on 06/17/15 to IMTS with delirium tremens and witnessed LOC per wife after not drinking alcohol for three days prior to admission. Ethanol level was undetectable on admission. He was placed on CIWA protocol with valium however due to consistently high CIWA's in the 20's requiring total 90 mg of valium and persistent agitation with tachycardia and tachypnea he required transfer to ICU on 9/30 for administration of precedex drip which he received until 10/2 with resolution of delirium tremens and agitation  Clinical Impression   Patient evaluated by Physical Therapy with no further acute PT needs identified. All education has been completed and the patient has no further questions.  . PT is signing off. Thank you for this referral.     Follow Up Recommendations No PT follow up    Equipment Recommendations  None recommended by PT    Recommendations for Other Services       Precautions / Restrictions Precautions Precautions: None      Mobility  Bed Mobility Overal bed mobility: Independent                Transfers Overall transfer level: Independent                  Ambulation/Gait Ambulation/Gait assistance: Independent   Assistive device: None Gait Pattern/deviations: WFL(Within Functional Limits)     General Gait Details: Independent  Stairs            Wheelchair Mobility    Modified Rankin (Stroke Patients Only)       Balance Overall balance assessment: No apparent balance deficits (not formally assessed)                                           Pertinent Vitals/Pain Pain Assessment: No/denies pain    Home Living Family/patient expects to  be discharged to:: Private residence Living Arrangements: Spouse/significant other Available Help at Discharge: Family Type of Home: House Home Access: Stairs to enter   Secretary/administrator of Steps: 4 Home Layout: One level        Prior Function Level of Independence: Independent         Comments: works in [employer]        Extremity/Trunk Assessment   Upper Extremity Assessment: Overall WFL for tasks assessed           Lower Extremity Assessment: Overall WFL for tasks assessed         Communication   Communication: Prefers language other than English ([place])  Cognition Arousal/Alertness: Awake/alert Behavior During Therapy: WFL for tasks assessed/performed Overall Cognitive Status: Within Functional Limits for tasks assessed                      General Comments General comments (skin integrity, edema, etc.): We discussed AA meeting in [facility]; he plans to look up meetings in Spanish    Exercises        Assessment/Plan    PT Assessment Patent does not need any further PT services  PT Diagnosis Generalized weakness   PT Problem List    PT  Treatment Interventions     PT Goals (Current goals can be found in the Care Plan section) Acute Rehab PT Goals Patient Stated Goal: Plans to attend AA meetings PT Goal Formulation: All assessment and education complete, DC therapy    Frequency     Barriers to discharge        Co-evaluation               End of Session   Activity Tolerance: Patient tolerated treatment well Patient left: Other (comment) (managing independently in hallway) Nurse Communication: Mobility status         Time: 1610-9604 PT Time Calculation (min) (ACUTE ONLY): 22 min   Charges:   PT Evaluation $Initial PT Evaluation Tier I: 1 Procedure     PT G CodesVan Clines Hamff 06/21/2015, 10:56 AM  Van Clines, PT  Acute Rehabilitation Services Pager  [phone] Office [phone]

## 2015-06-22 LAB — BASIC METABOLIC PANEL
ANION GAP: 8 (ref 5–15)
BUN: 8 mg/dL (ref 6–20)
CALCIUM: 9.1 mg/dL (ref 8.9–10.3)
CO2: 28 mmol/L (ref 22–32)
Chloride: 98 mmol/L — ABNORMAL LOW (ref 101–111)
Creatinine, Ser: 0.73 mg/dL (ref 0.61–1.24)
GLUCOSE: 130 mg/dL — AB (ref 65–99)
Potassium: 4.3 mmol/L (ref 3.5–5.1)
Sodium: 134 mmol/L — ABNORMAL LOW (ref 135–145)

## 2015-06-22 LAB — MAGNESIUM: Magnesium: 2 mg/dL (ref 1.7–2.4)

## 2015-06-22 LAB — HIV ANTIBODY (ROUTINE TESTING W REFLEX): HIV SCREEN 4TH GENERATION: NONREACTIVE

## 2015-06-22 MED ORDER — FOLIC ACID 1 MG PO TABS: 1.0000 mg | ORAL_TABLET | Freq: Every day | ORAL | Status: DC

## 2015-06-22 MED ORDER — THIAMINE HCL 100 MG PO TABS: 100.0000 mg | ORAL_TABLET | Freq: Every day | ORAL | Status: DC

## 2015-06-22 MED ORDER — ADULT MULTIVITAMIN W/MINERALS CH: 1.0000 | ORAL_TABLET | Freq: Every day | ORAL | Status: DC

## 2015-06-22 NOTE — Progress Notes (Signed)
Used [employer] to explain discharge instructions to pt. Pt verbalized understanding of all information including follow up appointments and new medications. IV's were dc'd without complication. VSS. Pt refused wheelchair.

## 2015-06-22 NOTE — Progress Notes (Signed)
   Subjective: Patient spanish-speaking and communication done through interpreter. Patient states he is feeling like his normal self, without tremors, anxiety, or hallucinations. States he wants to quit drinking and is interested in further counseling. Objective: Vital signs in last 24 hours: Filed Vitals:   06/21/15 1656 06/21/15 2100 06/22/15 0500 06/22/15 1000  BP: 146/90 142/96 140/91 139/91  Pulse: 74 108 80   Temp: 97.9 F (36.6 C) 98.1 F (36.7 C) 98.3 F (36.8 C) 98.7 F (37.1 C)  TempSrc: Oral Oral Oral Oral  Resp: Height:      Weight:  166 lb 1.6 oz (75.342 kg)    SpO2: 100% 100% 100% 100%   Weight change: -11.2 oz (-0.318 kg)  Intake/Output Summary (Last 24 hours) at 06/22/15 1451 Last data filed at 06/22/15 1300  Gross per 24 hour  Intake   1080 ml  Output      0 ml  Net   1080 ml   General: resting in bed Cardiac: RRR, no murmur Pulm: clear to auscultation bilaterally, moving normal volumes of air Abd: soft, nontender, nondistended, BS present Ext: no pedal edema Neuro: alert and oriented X3, no tremors  Assessment/Plan: Principal Problem:   Delirium tremens (HCC) Active Problems:   Alcohol abuse   Elevated fasting blood sugar   Hypokalemia   Elevated blood pressure  Delirium tremens: Patient transferred from ICU yesterday after needing to be on unit for delirium tremens and precedex treatment. His symptoms resolved and has not needed sedating medication overnight. -Ready for discharge with close follow up appointment with [facility] in [facility]  Dispo: Discharge today  The patient does not have a current PCP (No primary care provider on file.) and does not need an [facility] hospital follow-up appointment after discharge.     LOS: 5 days   Darreld Mclean, MD 06/22/2015, 2:51 PM
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
## 2015-06-22 NOTE — Discharge Instructions (Signed)
Please be sure to go to your scheduled appointment at [facility] on 06/29/2015.  Sndrome de abstinencia alcohlica (Alcohol Withdrawal) Cuando el consumo de drogas interfiere con las actividades normales de la vida se convierte en abuso. Esto incluye problemas con la familia y los amigos. La dependencia psicolgica existe cuando su mente le dice que necesita la droga. Esto generalmente es seguido por la dependencia fsica, que se produce al aumentar de [employer] continua la cantidad de droga necesaria para Systems analyst mismo efecto o el estado de "euforia". Esto se conoce como adiccin o dependencia qumica. El riesgo de [place] persona es mucho mayor si existen antecedentes de dependencia de qumicos en la familia. Sndrome de abstinencia leve al abandonar el consumo de alcohol cuando se ha desarrollado la adiccin o la dependencia qumica. Cuando una persona desarrolla la tolerancia al alcohol y de modo repentino deja de consumirlo, puede sufrir sntomas fsicos molestos. Geralmente son leves y Art therapist en temblores en las manos y aumento de la frecuencia cardiaca, de la respiracin y de Retail buyer. Algunas veces estos sntomas se asocian a una sensacin de ansiedad, crisis de [place] y pesadillas. Tambin Lobbyist de [place]. Los patrones normales de sueo generalmente se interrumpen con perodos de insomnio (imposibilidad para dormir). Estos sntomas pueden tener una duracin de 6 meses. Debido a estas molestias, muchas personas eligen continuar bebiendo para evitarlas y para tratar de sentirse normal.  Sndrome de abstinencia grave ante la disminucin de la ingesta o sin consumo de alcohol, cuando se ha desarrollado la adiccin o la dependencia qumica. Alrededor del cinco por ciento de los alcohlicos desarrollan signos de abstinencia grave cuando interrumpen el consumo de alcohol. Uno de los signos es la aparicin de convulsiones generalizadas Otros signos son agitacin  grave y confusin. Esto puede asociarse a las ideas delirantes y alucinaciones (la creencia en cosas que no son reales o ver cosas que no existen). Si la ingesta de alcohol se realiz durante un tiempo prolongado, puede haber deficiencias vitamnicas. Generalmente el tratamiento para esto requiere la hospitalizacin y un seguimiento intenso. Slo se puede ayudar a una persona que padece una adiccin si suspende el consumo de todas las sustancias qumicas. Esto es difcil de llevar a cabo, pero puede salvar su vida. Algunas consecuencias probables del consumo continuo de alcohol son la prdida de la autoestima, la violencia y Musician. La adiccin no puede curarse pero puede detenerse. A menudo esto requiere ayuda externa y la atencin profesional. Los centros de tratamiento estn enumerados en las pginas amarillas bajo el rubro: Cocana, Narcticos y Alcohlicos Annimos. Casi todos los hospitales y clnicas pueden derivarlo a un centro de atencin especializada. No es necesario que usted soporte los sntomas desagradables de la abstinencia. El profesional que lo asiste puede proporcionarle la medicacin que lo ayudar a Engineer, agricultural este perodo difcil. Trate de evitar las situaciones, los amigos o las drogas que hicieron posible que usted consumiera alcohol en el pasado. Aprenda a decir que no. Lleva un largo tiempo superar las adicciones a cualquier droga, incluso al alcohol. Puede haber momentos en los que usted sienta que necesita beber. Despus de liberarse de la adiccin fsica y del sndrome de abstinencia, sentir una disminucin en el deseo intenso que le indica que usted necesita alcohol para sentirse normal. Comunquese con el profesional que lo asiste si necesita ms apoyo Aprenda a Public house manager con quien debe hablar dentro de su familia y entre sus amigos, de modo que durante estos perodos pueda  recibir ayuda externa. AA (Alcohlicos Annimos) ha ayudado a Agricultural engineer. Para  obtener ms ayuda contctese con AA o comunquese con el profesional que lo asiste, consejero o sacerdote. Al-Anon y Alateen son grupos de ayuda para amigos y familiares de Optometrist. Las [employer] aman y cuidan a los adictos al alcohol tambin [place]. Para obtener informacin acerca de estas organizaciones, bsquelas en la gua telefnica de su localidad o llame a un centro local para el tratamiento del alcoholismo.  SOLICITE ATENCIN MDICA DE INMEDIATO SI:  Sufre convulsiones.  Sube la fiebre.  Sufre vmitos incontrolables o vomita sangre. Puede ser sangre de color rojo brillante o similar a borra de caf.  Maxie Better en la materia fecal. Puede ser de color rojo brillante o de aspecto alquitranado, con olor ftido.  Si se siente confundido o desfalleciente. No conduzca si se siente de [employer]. Deje que alguien conduzca por usted o llame al 911 para solicitar ayuda.  Se torna agitado o presenta un estado de confusin.  Desarrolla una ansiedad incontrolable.  Los pacientes pueden sufrir alucinaciones (ver, Tax adviser o sentir cosas que en realidad no existen). El profesional que lo asiste ha determinado que usted comprende plenamente su problema mdico y que su estado mental ha vuelto a la normalidad Comprende que ha recibido un tratamiento para el sndrome de abstinencia por alcohol, ha aceptado no beber alcohol al menos por un da, que no conducir un automvil ni manipular maquinarias durante al menos 24 horas y que ha tenido la oportunidad de formular todas las preguntas necesarias acerca de su problema. Document Released: 09/04/2005 Document Revised: 11/27/2011 [facility] Patient Information 2015 [place], [place]. This information is not intended to replace advice given to you by your health care provider. Make sure you discuss any questions you have with your health care provider.

## 2016-04-19 ENCOUNTER — Encounter (HOSPITAL_COMMUNITY): Payer: Self-pay | Admitting: Emergency Medicine

## 2016-04-19 ENCOUNTER — Inpatient Hospital Stay (HOSPITAL_COMMUNITY)
Admission: EM | Admit: 2016-04-19 | Discharge: 2016-04-24 | DRG: 897 | Disposition: A | Discharge status: Home / Self Care | Payer: Self-pay | Attending: Internal Medicine | Admitting: Internal Medicine

## 2016-04-19 ENCOUNTER — Observation Stay (HOSPITAL_COMMUNITY): Payer: Self-pay

## 2016-04-19 DIAGNOSIS — R441 Visual hallucinations: Secondary | ICD-10-CM | POA: Diagnosis present

## 2016-04-19 DIAGNOSIS — R7401 Elevation of levels of liver transaminase levels: Secondary | ICD-10-CM | POA: Diagnosis present

## 2016-04-19 DIAGNOSIS — I10 Essential (primary) hypertension: Secondary | ICD-10-CM | POA: Diagnosis present

## 2016-04-19 DIAGNOSIS — R569 Unspecified convulsions: Secondary | ICD-10-CM | POA: Diagnosis present

## 2016-04-19 DIAGNOSIS — R030 Elevated blood-pressure reading, without diagnosis of hypertension: Secondary | ICD-10-CM

## 2016-04-19 DIAGNOSIS — Z79899 Other long term (current) drug therapy: Secondary | ICD-10-CM

## 2016-04-19 DIAGNOSIS — R740 Nonspecific elevation of levels of transaminase and lactic acid dehydrogenase [LDH]: Secondary | ICD-10-CM | POA: Diagnosis present

## 2016-04-19 DIAGNOSIS — F10931 Alcohol use, unspecified with withdrawal delirium: Secondary | ICD-10-CM | POA: Diagnosis present

## 2016-04-19 DIAGNOSIS — Z811 Family history of alcohol abuse and dependence: Secondary | ICD-10-CM

## 2016-04-19 DIAGNOSIS — IMO0001 Reserved for inherently not codable concepts without codable children: Secondary | ICD-10-CM | POA: Diagnosis present

## 2016-04-19 DIAGNOSIS — E876 Hypokalemia: Secondary | ICD-10-CM | POA: Diagnosis present

## 2016-04-19 DIAGNOSIS — F1010 Alcohol abuse, uncomplicated: Secondary | ICD-10-CM | POA: Diagnosis present

## 2016-04-19 DIAGNOSIS — D696 Thrombocytopenia, unspecified: Secondary | ICD-10-CM | POA: Diagnosis present

## 2016-04-19 DIAGNOSIS — F10121 Alcohol abuse with intoxication delirium: Secondary | ICD-10-CM | POA: Insufficient documentation

## 2016-04-19 DIAGNOSIS — Z781 Physical restraint status: Secondary | ICD-10-CM

## 2016-04-19 DIAGNOSIS — H5316 Psychophysical visual disturbances: Secondary | ICD-10-CM

## 2016-04-19 DIAGNOSIS — Z87891 Personal history of nicotine dependence: Secondary | ICD-10-CM

## 2016-04-19 DIAGNOSIS — M6282 Rhabdomyolysis: Secondary | ICD-10-CM | POA: Diagnosis present

## 2016-04-19 DIAGNOSIS — F10231 Alcohol dependence with withdrawal delirium: Principal | ICD-10-CM | POA: Diagnosis present

## 2016-04-19 LAB — BASIC METABOLIC PANEL
Anion gap: 6 (ref 5–15)
Anion gap: 5 (ref 5–15)
CHLORIDE: 110 mmol/L (ref 101–111)
CHLORIDE: 108 mmol/L (ref 101–111)
CO2: 24 mmol/L (ref 22–32)
CO2: 25 mmol/L (ref 22–32)
CREATININE: 0.57 mg/dL — AB (ref 0.61–1.24)
CREATININE: 0.59 mg/dL — AB (ref 0.61–1.24)
Calcium: 8.8 mg/dL — ABNORMAL LOW (ref 8.9–10.3)
Calcium: 8.4 mg/dL — ABNORMAL LOW (ref 8.9–10.3)
GFR calc Af Amer: >60 mL/min (ref >60)
GFR calc Af Amer: >60 mL/min (ref >60)
GFR calc non Af Amer: >60 mL/min (ref >60)
GFR calc non Af Amer: >60 mL/min (ref >60)
GLUCOSE: 120 mg/dL — AB (ref 65–99)
GLUCOSE: 116 mg/dL — AB (ref 65–99)
POTASSIUM: 3.4 mmol/L — AB (ref 3.5–5.1)
POTASSIUM: 3.7 mmol/L (ref 3.5–5.1)
SODIUM: 140 mmol/L (ref 135–145)
SODIUM: 138 mmol/L (ref 135–145)

## 2016-04-19 LAB — COMPREHENSIVE METABOLIC PANEL
ALT: 52 U/L (ref 17–63)
ANION GAP: 9 (ref 5–15)
AST: 78 U/L — AB (ref 15–41)
Albumin: 4.3 g/dL (ref 3.5–5.0)
Alkaline Phosphatase: 54 U/L (ref 38–126)
BILIRUBIN TOTAL: 0.4 mg/dL (ref 0.3–1.2)
BUN: 6 mg/dL (ref 6–20)
CHLORIDE: 100 mmol/L — AB (ref 101–111)
CO2: 27 mmol/L (ref 22–32)
Calcium: 9.9 mg/dL (ref 8.9–10.3)
Creatinine, Ser: 0.71 mg/dL (ref 0.61–1.24)
Glucose, Bld: 95 mg/dL (ref 65–99)
Potassium: 3.7 mmol/L (ref 3.5–5.1)
Sodium: 136 mmol/L (ref 135–145)
TOTAL PROTEIN: 6.8 g/dL (ref 6.5–8.1)

## 2016-04-19 LAB — CBC
HEMATOCRIT: 36.7 % — AB (ref 39.0–52.0)
HEMATOCRIT: 39.3 % (ref 39.0–52.0)
HEMOGLOBIN: 14.1 g/dL (ref 13.0–17.0)
Hemoglobin: 13 g/dL (ref 13.0–17.0)
MCH: 36.5 pg — ABNORMAL HIGH (ref 26.0–34.0)
MCH: 35.8 pg — AB (ref 26.0–34.0)
MCHC: 35.4 g/dL (ref 30.0–36.0)
MCHC: 35.9 g/dL (ref 30.0–36.0)
MCV: 101.8 fL — ABNORMAL HIGH (ref 78.0–100.0)
MCV: 101.1 fL — AB (ref 78.0–100.0)
PLATELETS: 97 10*3/uL — AB (ref 150–400)
Platelets: 112 10*3/uL — ABNORMAL LOW (ref 150–400)
RBC: 3.63 MIL/uL — AB (ref 4.22–5.81)
RBC: 3.86 MIL/uL — AB (ref 4.22–5.81)
RDW: 12.9 % (ref 11.5–15.5)
RDW: 12.8 % (ref 11.5–15.5)
WBC: 6.4 10*3/uL (ref 4.0–10.5)
WBC: 8.2 10*3/uL (ref 4.0–10.5)

## 2016-04-19 LAB — RAPID URINE DRUG SCREEN, HOSP PERFORMED
AMPHETAMINES: NOT DETECTED
BARBITURATES: NOT DETECTED
BENZODIAZEPINES: POSITIVE — AB
Cocaine: NOT DETECTED
OPIATES: NOT DETECTED
TETRAHYDROCANNABINOL: NOT DETECTED

## 2016-04-19 LAB — POCT I-STAT 3, ART BLOOD GAS (G3+)
ACID-BASE DEFICIT: 3 mmol/L — AB (ref 0.0–2.0)
BICARBONATE: 23.5 meq/L (ref 20.0–24.0)
O2 Saturation: 97 %
PH ART: 7.332 — AB (ref 7.350–7.450)
TCO2: 25 mmol/L (ref 0–100)
pCO2 arterial: 44.4 mmHg (ref 35.0–45.0)
pO2, Arterial: 93 mmHg (ref 80.0–100.0)

## 2016-04-19 LAB — CREATININE, SERUM
Creatinine, Ser: 0.68 mg/dL (ref 0.61–1.24)
GFR calc Af Amer: >60 mL/min (ref >60)
GFR calc non Af Amer: >60 mL/min (ref >60)

## 2016-04-19 LAB — GLUCOSE, CAPILLARY
GLUCOSE-CAPILLARY: 138 mg/dL — AB (ref 65–99)
Glucose-Capillary: 116 mg/dL — ABNORMAL HIGH (ref 65–99)

## 2016-04-19 LAB — CK
CK TOTAL: 2417 U/L — AB (ref 49–397)
Total CK: 2326 U/L — ABNORMAL HIGH (ref 49–397)
Total CK: 2692 U/L — ABNORMAL HIGH (ref 49–397)

## 2016-04-19 LAB — MRSA PCR SCREENING: MRSA BY PCR: NEGATIVE

## 2016-04-19 LAB — ETHANOL

## 2016-04-19 LAB — PROTIME-INR
INR: 1.18
PROTHROMBIN TIME: 15 s (ref 11.4–15.2)

## 2016-04-19 LAB — MAGNESIUM: Magnesium: 1.7 mg/dL (ref 1.7–2.4)

## 2016-04-19 LAB — PHOSPHORUS: PHOSPHORUS: 3.8 mg/dL (ref 2.5–4.6)

## 2016-04-19 LAB — APTT: APTT: 27 s (ref 24–36)

## 2016-04-19 LAB — AMMONIA: Ammonia: 43 umol/L — ABNORMAL HIGH (ref 9–35)

## 2016-04-19 LAB — AMYLASE: AMYLASE: 87 U/L (ref 28–100)

## 2016-04-19 LAB — LACTIC ACID, PLASMA: Lactic Acid, Venous: 1.3 mmol/L (ref 0.5–1.9)

## 2016-04-19 LAB — LIPASE, BLOOD: Lipase: 57 U/L — ABNORMAL HIGH (ref 11–51)

## 2016-04-19 MED ORDER — LORAZEPAM 2 MG/ML IJ SOLN
2.0000 mg | Freq: Once | INTRAMUSCULAR | Status: AC
  Administered 2016-04-19: 2 mg via INTRAVENOUS
  Filled 2016-04-19: qty 1

## 2016-04-19 MED ORDER — ADULT MULTIVITAMIN W/MINERALS CH
1.0000 | ORAL_TABLET | Freq: Every day | ORAL | Status: DC
  Administered 2016-04-20 – 2016-04-24 (×5): 1 via ORAL
  Filled 2016-04-19 (×5): qty 1

## 2016-04-19 MED ORDER — LORAZEPAM 1 MG PO TABS: 1.0000 mg | ORAL_TABLET | Freq: Four times a day (QID) | ORAL | Status: DC | PRN

## 2016-04-19 MED ORDER — DEXTROSE 50 % IV SOLN
1.0000 | Freq: Once | INTRAVENOUS | Status: AC
  Administered 2016-04-19: 50 mL via INTRAVENOUS
  Filled 2016-04-19: qty 50

## 2016-04-19 MED ORDER — ONDANSETRON HCL 4 MG PO TABS: 4.0000 mg | ORAL_TABLET | Freq: Four times a day (QID) | ORAL | Status: DC | PRN

## 2016-04-19 MED ORDER — ONDANSETRON HCL 4 MG/2ML IJ SOLN: 4.0000 mg | Freq: Four times a day (QID) | INTRAMUSCULAR | Status: DC | PRN

## 2016-04-19 MED ORDER — SODIUM CHLORIDE 0.9% FLUSH
3.0000 mL | Freq: Two times a day (BID) | INTRAVENOUS | Status: DC
  Administered 2016-04-19 – 2016-04-24 (×4): 3 mL via INTRAVENOUS

## 2016-04-19 MED ORDER — ENOXAPARIN SODIUM 40 MG/0.4ML ~~LOC~~ SOLN: 40.0000 mg | SUBCUTANEOUS | Status: DC

## 2016-04-19 MED ORDER — LORAZEPAM 2 MG/ML IJ SOLN
2.0000 mg | INTRAMUSCULAR | Status: DC | PRN
  Administered 2016-04-19 – 2016-04-20 (×5): 2 mg via INTRAVENOUS
  Filled 2016-04-19 (×5): qty 1

## 2016-04-19 MED ORDER — ACETAMINOPHEN 325 MG PO TABS: 650.0000 mg | ORAL_TABLET | Freq: Four times a day (QID) | ORAL | Status: DC | PRN

## 2016-04-19 MED ORDER — THIAMINE HCL 100 MG/ML IJ SOLN
100.0000 mg | Freq: Every day | INTRAMUSCULAR | Status: DC
Start: 2016-04-19 — End: 2016-04-23
  Filled 2016-04-19 (×4): qty 2

## 2016-04-19 MED ORDER — LABETALOL HCL 5 MG/ML IV SOLN: 20.0000 mg | INTRAVENOUS | Status: DC | PRN

## 2016-04-19 MED ORDER — FOLIC ACID 1 MG PO TABS
1.0000 mg | ORAL_TABLET | Freq: Every day | ORAL | Status: DC
  Administered 2016-04-20 – 2016-04-24 (×5): 1 mg via ORAL
  Filled 2016-04-19 (×5): qty 1

## 2016-04-19 MED ORDER — ASPIRIN 81 MG PO CHEW: 324.0000 mg | CHEWABLE_TABLET | ORAL | Status: AC

## 2016-04-19 MED ORDER — DIAZEPAM 5 MG/ML IJ SOLN
INTRAMUSCULAR | Status: AC
  Filled 2016-04-19: qty 2

## 2016-04-19 MED ORDER — MIDAZOLAM HCL 2 MG/2ML IJ SOLN
2.0000 mg | Freq: Once | INTRAMUSCULAR | Status: AC
  Administered 2016-04-19: 2 mg via INTRAVENOUS

## 2016-04-19 MED ORDER — SODIUM CHLORIDE 0.9 % IV SOLN
INTRAVENOUS | Status: DC
  Administered 2016-04-19: 10:00:00 via INTRAVENOUS

## 2016-04-19 MED ORDER — CLONIDINE HCL 0.3 MG/24HR TD PTWK
0.3000 mg | MEDICATED_PATCH | TRANSDERMAL | Status: DC
  Administered 2016-04-19: 0.3 mg via TRANSDERMAL
  Filled 2016-04-19: qty 1

## 2016-04-19 MED ORDER — ENOXAPARIN SODIUM 40 MG/0.4ML ~~LOC~~ SOLN
40.0000 mg | SUBCUTANEOUS | Status: DC
  Administered 2016-04-19 – 2016-04-23 (×5): 40 mg via SUBCUTANEOUS
  Filled 2016-04-19 (×5): qty 0.4

## 2016-04-19 MED ORDER — FAMOTIDINE IN NACL 20-0.9 MG/50ML-% IV SOLN
20.0000 mg | Freq: Two times a day (BID) | INTRAVENOUS | Status: DC
  Administered 2016-04-19 – 2016-04-20 (×3): 20 mg via INTRAVENOUS
  Filled 2016-04-19 (×5): qty 50

## 2016-04-19 MED ORDER — LORAZEPAM 2 MG/ML IJ SOLN: 0.0000 mg | Freq: Two times a day (BID) | INTRAMUSCULAR | Status: DC

## 2016-04-19 MED ORDER — DIAZEPAM 5 MG/ML IJ SOLN
10.0000 mg | Freq: Once | INTRAMUSCULAR | Status: AC
  Administered 2016-04-19: 10 mg via INTRAVENOUS
  Filled 2016-04-19: qty 2

## 2016-04-19 MED ORDER — SODIUM CHLORIDE 0.9 % IV SOLN
Freq: Once | INTRAVENOUS | Status: AC
  Administered 2016-04-19: 05:00:00 via INTRAVENOUS

## 2016-04-19 MED ORDER — DIAZEPAM 5 MG/ML IJ SOLN
5.0000 mg | Freq: Once | INTRAMUSCULAR | Status: AC
  Administered 2016-04-19: 5 mg via INTRAVENOUS

## 2016-04-19 MED ORDER — MAGNESIUM SULFATE 2 GM/50ML IV SOLN
2.0000 g | Freq: Once | INTRAVENOUS | Status: AC
  Administered 2016-04-19: 2 g via INTRAVENOUS
  Filled 2016-04-19: qty 50

## 2016-04-19 MED ORDER — HYDRALAZINE HCL 20 MG/ML IJ SOLN
10.0000 mg | Freq: Four times a day (QID) | INTRAMUSCULAR | Status: DC | PRN
  Administered 2016-04-19: 10 mg via INTRAVENOUS
  Filled 2016-04-19: qty 1

## 2016-04-19 MED ORDER — DEXMEDETOMIDINE HCL IN NACL 400 MCG/100ML IV SOLN
0.2000 ug/kg/h | INTRAVENOUS | Status: AC
  Administered 2016-04-19: 0.6 ug/kg/h via INTRAVENOUS
  Administered 2016-04-19: 0.2 ug/kg/h via INTRAVENOUS
  Administered 2016-04-19: 0.7 ug/kg/h via INTRAVENOUS
  Administered 2016-04-19: 0.6 ug/kg/h via INTRAVENOUS
  Administered 2016-04-19: 1.7 ug/kg/h via INTRAVENOUS
  Administered 2016-04-20 (×2): 1.5 ug/kg/h via INTRAVENOUS
  Filled 2016-04-19 (×2): qty 100
  Filled 2016-04-19 (×2): qty 50
  Filled 2016-04-19: qty 100
  Filled 2016-04-19 (×3): qty 50
  Filled 2016-04-19: qty 100

## 2016-04-19 MED ORDER — VITAMIN B-1 100 MG PO TABS
100.0000 mg | ORAL_TABLET | Freq: Every day | ORAL | Status: DC
  Administered 2016-04-20 – 2016-04-24 (×5): 100 mg via ORAL
  Filled 2016-04-19 (×5): qty 1

## 2016-04-19 MED ORDER — LORAZEPAM 2 MG/ML IJ SOLN
0.0000 mg | Freq: Four times a day (QID) | INTRAMUSCULAR | Status: DC
  Administered 2016-04-19: 2 mg via INTRAVENOUS
  Filled 2016-04-19 (×2): qty 1

## 2016-04-19 MED ORDER — ASPIRIN 300 MG RE SUPP: 300.0000 mg | RECTAL | Status: AC

## 2016-04-19 MED ORDER — LACTATED RINGERS IV SOLN
INTRAVENOUS | Status: DC
  Administered 2016-04-19: 150 mL/h via INTRAVENOUS
  Administered 2016-04-19 – 2016-04-20 (×3): via INTRAVENOUS

## 2016-04-19 MED ORDER — LORAZEPAM 2 MG/ML IJ SOLN
1.0000 mg | Freq: Four times a day (QID) | INTRAMUSCULAR | Status: DC | PRN
  Administered 2016-04-19: 1 mg via INTRAVENOUS

## 2016-04-19 MED ORDER — METOPROLOL TARTRATE 5 MG/5ML IV SOLN: 2.5000 mg | INTRAVENOUS | Status: DC | PRN

## 2016-04-19 MED ORDER — DIAZEPAM 5 MG/ML IJ SOLN: 5.0000 mg | Freq: Once | INTRAMUSCULAR | Status: DC

## 2016-04-19 MED ORDER — ACETAMINOPHEN 650 MG RE SUPP: 650.0000 mg | Freq: Four times a day (QID) | RECTAL | Status: DC | PRN

## 2016-04-19 MED ORDER — MIDAZOLAM HCL 2 MG/2ML IJ SOLN
INTRAMUSCULAR | Status: AC
  Filled 2016-04-19: qty 2

## 2016-04-19 MED ORDER — HALOPERIDOL LACTATE 5 MG/ML IJ SOLN
5.0000 mg | Freq: Once | INTRAMUSCULAR | Status: AC
  Administered 2016-04-19: 5 mg via INTRAMUSCULAR
  Filled 2016-04-19: qty 1

## 2016-04-19 NOTE — ED Notes (Signed)
Pt attempting to climb out of bed, wife and safety sitter got pt back on stretcher.  Pt pulled PIV out of L forearm, area cleaned and bandaged.  Pt is very restless, pulling blood pressure cuff and monitor wires off.  Additional PIV placed to R forearm, pt tolerated well.

## 2016-04-19 NOTE — Consult Note (Signed)
PULMONARY / CRITICAL CARE MEDICINE   Name: Ryan Greer MRN: [id number] DOB: 01/29/1981    ADMISSION DATE:  04/19/2016   CONSULTATION DATE:  04/19/2016  REFERRING MD:  Dr Shelly Flatten  CHIEF COMPLAINT: Acute alcohol withdrawal  HISTORY OF PRESENT ILLNESS:   Ryan Greer is a 35 y.o. male with a medical history significant for etoh abuse. He has drank heavily since age 11. Patient tried to quit drinking last year at which time he required ICU admission for precedex. Patient normally drinks about 18 beers a day. He decided to stop drinking a few days ago. He had only one drink on Sunday and by Sunday evening / Monday patient began shaking and had at least one seizure per wife. Following the seizure patient had an episode of vomiting and diarrhea. Patient is confused, hallucinating, extremely agitated and cannot provide history.  PAST MEDICAL HISTORY :  He  has a past medical history of ETOH abuse.  PAST SURGICAL HISTORY: He  has no past surgical history on file.  No Known Allergies  No current facility-administered medications on file prior to encounter.    Current Outpatient Prescriptions on File Prior to Encounter  Medication Sig  . folic acid (FOLVITE) 1 MG tablet Take 1 tablet (1 mg total) by mouth daily. (Patient not taking: Reported on 04/19/2016)  . Multiple Vitamin (MULTIVITAMIN WITH MINERALS) TABS tablet Take 1 tablet by mouth daily. (Patient not taking: Reported on 04/19/2016)  . thiamine 100 MG tablet Take 1 tablet (100 mg total) by mouth daily. (Patient not taking: Reported on 04/19/2016)    FAMILY HISTORY:  His indicated that the status of his brother is unknown.    SOCIAL HISTORY: He  reports that he has quit smoking. He does not have any smokeless tobacco history on file. He reports that he drinks alcohol. He reports that he does not use drugs.  REVIEW OF SYSTEMS:   Negative except HPI  SUBJECTIVE:    VITAL SIGNS: BP 120/71 (BP Location: Left Arm)   Pulse 96    Temp 98 F (36.7 C) (Oral)   Resp 18   Ht 5\' 6"  (1.676 m)   Wt 175 lb (79.4 kg)   SpO2 99%   BMI 28.25 kg/m   HEMODYNAMICS:    VENTILATOR SETTINGS:    INTAKE / OUTPUT: No intake/output data recorded.  PHYSICAL EXAMINATION: General: Middle aged male thrashing in bed, diaphoretic and talking in spanish Neuro: Extremely agitated in bed requiring 4 point restraints. Having hallucinations. Moving all extremities equally. Will intermittently follow commands.  HEENT: Moist mucous membranes.  Cardiovascular: S1S2, tachycardic. No murmurs Lungs: Clear hroughout Abdomen: Non-distended, no hepatomegaly Musculoskeletal: Age appropriate tone and bulk Skin: No visible rashes  LABS:  BMET  Recent Labs Lab 04/19/16 0035  NA 136  K 3.7  CL 100*  CO2 27  BUN 6  CREATININE 0.71  GLUCOSE 95    Electrolytes  Recent Labs Lab 04/19/16 0035 04/19/16 0807  CALCIUM 9.9  --   MG  --  1.7  PHOS 3.8  --     CBC  Recent Labs Lab 04/19/16 0035  WBC 8.2  HGB 14.1  HCT 39.3  PLT 112*    Coag's No results for input(s): APTT, INR in the last 168 hours.  Sepsis Markers  Recent Labs Lab 04/19/16 0807  LATICACIDVEN 1.3    ABG No results for input(s): PHART, PCO2ART, PO2ART in the last 168 hours.  Liver Enzymes  Recent Labs Lab 04/19/16 0035  AST 78*  ALT 52  ALKPHOS 54  BILITOT 0.4  ALBUMIN 4.3    Cardiac Enzymes No results for input(s): TROPONINI, PROBNP in the last 168 hours.  Glucose No results for input(s): GLUCAP in the last 168 hours.  Imaging No results found.   STUDIES:  none  CULTURES: none  ANTIBIOTICS: none  SIGNIFICANT EVENTS: 8/2: Admitted to ICU with PCCM consult for management of acute ETOH withdrawal delirium. Currently sedated on Precedex drip.   LINES/TUBES: PIV x 2 in bilateral lower extremities  DISCUSSION: 35 yo  hispanic male with extensive drinking history. He began drinking around the age of 18 and currently  drinks approximately 18 beers per day. He was admitted last year for ETOH withdrawal and was admitted to the ICU and sedated on Precedex drip. Wife state he took his last drink on Sunday 7/30, and began shaking that evening. She reports seizure like activity, with vomiting and diarrhea. He is currently sedated on  Precedex drip, but may require intubation for control. PCCM is consulted for management. Plan is to admit to ICU, continue Precedex and Ativan for sedation, high risk for intubation given extreme agitation. Continue thiamine and folate. Monitor CK Q6 hours and give fluid resuscitation as CK very elevated on admission labs.   ASSESSMENT / PLAN:  PULMONARY A: Monitor for need of intubation  P:   Intubation may be required to provide adequate sedation for withdrawal High dose precedex and PRN Ativan Monitor work of breathing, ability to protect airway and O2 sats O2 as needed to maintain sats > 92%  CARDIOVASCULAR A:  Tachycardia Hypertension  P:  Tachycardia improved once adequately sedated Hydralazine and Labetalol PRN for HTN Continuous cardiac monitoring Monitor for bradycardia in the setting of high dose precedex   RENAL A:   Rhabdomyolysis CK 2417  P:   Insert foley for accurate I&O LR at 150 ml/hr Continue to monitor CK Q6 hours BMP Q6 hours >> monitor renal function in the setting of Rhabdo Replace K/Mag as needed   GASTROINTESTINAL A:   NPO Elevated liver enzymes  P:   Pepcid for GI prophylaxis while NPO Will evaluate ability to eat as delirium progresses May need NGT for meds and enteral nutrition CMP in the AM Amylase and Lipase as baseline in the setting of ETOH abuse Send Ammonia level  HEMATOLOGIC A:   Thrombocytopenia DVT prophylaxis  P:  May be reactive given acute state Monitor for signs/Symptoms of bleeding >> no acute signs Transfuse for HgB < 7 Lovenox for DVT prophylaxis SCD  INFECTIOUS A:   No acute issues  P:    Continue to monitor for signs of infection Follow CBC and fever curve   ENDOCRINE A:   No acute issues  P:   CBG q 4 hours while NPO Monitor for hypoglycemia  NEUROLOGIC A:   Acute ETOH withdrawal Delirium  P:   RASS goal: 0 Frequent neuro checks Continue high dose Precedex and PRN Ativan Continue high dose Thiamine and Folate Ammonia level pending May need intubation with high dose sedatives.    FAMILY  - Updates: Wife updated at bedside, she speaks minimal [place]  - Inter-disciplinary family meet or Palliative Care meeting due by: 04/26/2016    STAFF NOTE: I, Rory Percy, MD FACP have personally reviewed patient's available data, including medical history, events of note, physical examination and test results as part of my evaluation. I have discussed with resident/NP and other care providers such as pharmacist, RN and RRT.  In addition, I personally evaluated patient and elicited key findings of:  Lethargic now, cta, perr, severe ETOH , known rto have difficult course in past, no infection noted, no seziures clinically, now in ICU in precedex s/p multiple meds benzo needed, now somewhat concerning RASS, will assess abg, may need ett, npo, thimaine, folic, hydration, cpk noted no rash or muscle injury noted, follow cpk with hydration and renal fxn, I have fullyupdated wif ein full at bedside The patient is critically ill with multiple organ systems failure and requires high complexity decision making for assessment and support, frequent evaluation and titration of therapies, application of advanced monitoring technologies and extensive interpretation of multiple databases.   Critical Care Time devoted to patient care services described in this note is35 Minutes. This time reflects time of care of this signee: Rory Percy, MD FACP. This critical care time does not reflect procedure time, or teaching time or supervisory time of PA/NP/Med student/Med Resident etc but  could involve care discussion time. Rest per NP/medical resident whose note is outlined above and that I agree with   Mcarthur Rossetti. Tyson Alias, MD, FACP Pgr: [phone] [facility] 04/19/2016 12:16 PM

## 2016-04-19 NOTE — ED Notes (Signed)
Pt currently having visual hallucinations of "a man coming in to sell tacos"

## 2016-04-19 NOTE — ED Notes (Signed)
Attempted report 

## 2016-04-19 NOTE — ED Notes (Signed)
Soft restraints placed to bilateral arms and legs.  Pt tolerated well.

## 2016-04-19 NOTE — ED Triage Notes (Signed)
Pt. presents diaphoretic , hand tremors/shaking and visual hallucinations onset today , last ETOH drink 3 days ago . Denies suicidal ideation. Respirations unlabored , alert and oriented .

## 2016-04-19 NOTE — ED Notes (Signed)
PIV removed from R forearm, catheter out.  PIV restarted in legs per verbal order from Dr. Anitra Lauth.

## 2016-04-19 NOTE — ED Notes (Signed)
Security at bedside

## 2016-04-19 NOTE — ED Provider Notes (Signed)
[facility]-EMERGENCY DEPT Provider Note   CSN: [id number] Arrival date & time: 04/19/16  0019  First Provider Contact:  First MD Initiated Contact with Patient 04/19/16 0501        History   Chief Complaint Chief Complaint  Patient presents with  . Withdrawal  . Alcohol Problem    HPI Ryan Greer is a 35 y.o. male.  This is a 35 year old Hispanic male with a history of ETOH abuse who has not had a drink in 3 days he had had withdrawal seizures, tremors and is having visual hallucinations.  He is requesting help with detox at this time  Wife reports having had 2 episodes of generalized shaking lasting several minutes followed by a period of confusion. 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 ETOH abuse     Patient Active Problem List   Diagnosis Date Noted  . Elevated fasting blood sugar 06/21/2015  . Hypokalemia 06/21/2015  . Elevated blood pressure 06/21/2015  . Delirium tremens (HCC)   . Alcohol abuse     History reviewed. No pertinent surgical history.     Home Medications    Prior to Admission medications   Medication Sig Start Date End Date Taking? Authorizing Provider  folic acid (FOLVITE) 1 MG tablet Take 1 tablet (1 mg total) by mouth daily. 06/22/15   Darreld Mclean, MD  Multiple Vitamin (MULTIVITAMIN WITH MINERALS) TABS tablet Take 1 tablet by mouth daily. 06/22/15   Darreld Mclean, MD  thiamine 100 MG tablet Take 1 tablet (100 mg total) by mouth daily. 06/22/15   Darreld Mclean, MD    Family History No family history on file.  Social History Social History  Substance Use Topics  . Smoking status: Current Every Day Smoker  . Smokeless tobacco: Not on file  . Alcohol use Yes     Allergies   Review of patient's allergies indicates no known allergies.   Review of Systems Review of Systems  Constitutional: Positive for diaphoresis. Negative for appetite change.  Respiratory: Negative  for shortness of breath.   Cardiovascular: Negative for chest pain.  Gastrointestinal: Negative for abdominal pain, nausea and vomiting.  Skin: Negative for rash and wound.  Neurological: Positive for tremors.  Psychiatric/Behavioral: Positive for hallucinations.  All other systems reviewed and are negative.    Physical Exam Updated Vital Signs BP 138/98   Pulse 88   Temp 98 F (36.7 C) (Oral)   Resp 19   Ht  (1.676 m)   Wt 79.4 kg   SpO2 100%   BMI 28.25 kg/m   Physical Exam  Constitutional: He is oriented to person, place, and time. He appears well-developed and well-nourished.  HENT:  Head: Normocephalic.  Eyes: Pupils are equal, round, and reactive to light.  Neck: Normal range of motion.  Cardiovascular: Normal rate.   Pulmonary/Chest: Effort normal.  Abdominal: Soft.  Musculoskeletal: Normal range of motion.  Neurological: He is alert and oriented to person, place, and time.  Skin: Skin is warm and dry.  Psychiatric: His speech is normal. His affect is inappropriate. He is agitated and actively hallucinating. Thought content is not delusional. Cognition and memory are impaired. He expresses inappropriate judgment. He expresses no homicidal and no suicidal ideation. He expresses no suicidal plans and no homicidal plans.  Nursing note and vitals reviewed.    ED Treatments / Results  Labs (all labs ordered  are listed, but only abnormal results are displayed) Labs Reviewed  COMPREHENSIVE METABOLIC PANEL - Abnormal; Notable for the following:       Result Value   Chloride 100 (*)    AST 78 (*)    All other components within normal limits  CBC - Abnormal; Notable for the following:    RBC 3.86 (*)    MCV 101.8 (*)    MCH 36.5 (*)    Platelets 112 (*)    All other components within normal limits  URINE RAPID DRUG SCREEN, HOSP PERFORMED - Abnormal; Notable for the following:    Benzodiazepines POSITIVE (*)    All other components within normal limits    ETHANOL    EKG  EKG Interpretation None       Radiology No results found.  Procedures Procedures (including critical care time)  Medications Ordered in ED Medications  0.9 %  sodium chloride infusion (not administered)  LORazepam (ATIVAN) tablet 1 mg (not administered)    Or  LORazepam (ATIVAN) injection 1 mg (not administered)  thiamine (VITAMIN B-1) tablet 100 mg (not administered)    Or  thiamine (B-1) injection 100 mg (not administered)  folic acid (FOLVITE) tablet 1 mg (not administered)  multivitamin with minerals tablet 1 tablet (not administered)  LORazepam (ATIVAN) injection 0-4 mg (not administered)    Followed by  LORazepam (ATIVAN) injection 0-4 mg (not administered)     Initial Impression / Assessment and Plan / ED Course  I have reviewed the triage vital signs and the nursing notes.  Pertinent labs & imaging results that were available during my care of the patient were reviewed by me and considered in my medical decision making (see chart for details).  Clinical Course     Hydrate CIWA protocol admit for detox   Final Clinical Impressions(s) / ED Diagnoses   Final diagnoses:  None    New Prescriptions New Prescriptions   No medications on file     Earley Favor, NP 04/19/16 0513    Earley Favor, NP 04/19/16 3664    Derwood Kaplan, MD 04/22/16 4034

## 2016-04-19 NOTE — Plan of Care (Signed)
35 year old male with history of alcohol abuse has not had any alcoholic drink for the last 3 days and presents to the ER because of seizure-like episodes witnessed by patient's wife. Patient is also hallucinating in the ER and has been placed on CIWA protocol. CT head is pending and will be admitted for alcohol withdrawal with delirium. As per the ER physician patient is cooperating and following commands and does not require ICU.  Ryan Greer.

## 2016-04-19 NOTE — ED Notes (Signed)
Security and [employer] called due to pt getting violent with staff.

## 2016-04-19 NOTE — ED Notes (Signed)
Attempted to call report

## 2016-04-19 NOTE — H&P (Signed)
History and Physical    Ryan Greer SFS:[id number] DOB: 1980-10-23 DOA: 04/19/2016  PCP: No primary care provider on file.  Patient coming from:   Home    Chief Complaint:  alcohol withdrawl  HPI: Ryan Greer is a 35 y.o. male with a medical history significant for etoh abuse. He has drank heavily since age 29. Patient tried to quit drinking last year at which time he required ICU admission for precedex. Patient normally drinks several etoh beverages a day. He decided to stop drinking a few days ago. He had only one drink on Sunday and by Sunday evening / Monday patient began shaking and had at least one seizure per wife. Following the seizure patient had an episode of vomiting and diarrhea. Patient is confused, hallucinating, agitated and cannot provide history.   ED Course:  Afebrile, tachycardia at 145, hypertensive at 155/108.   Valium 5mg , Ativan 1mg  and 0.4mg    Review of Systems:  Unable to obtain as patient is confused, hallucinating   Past Medical History:  Diagnosis Date  . ETOH abuse     History reviewed. No pertinent surgical history.  Social History   Social History  . Marital status: Married    Spouse name: N/A  . Number of children: N/A  . Years of education: N/A   Occupational History  . Not on file.   Social History Main Topics  . Smoking status: Former smoker  . Smokeless tobacco: Not on file  . Alcohol use Yes  . Drug use: No  . Sexual activity: Not on file   Per wife patient is a non-smoker / no drug use  Other Topics Concern    No Known Allergies  Family History  Problem Relation Age of Onset  . Alcohol abuse Brother     Prior to Admission medications   Medication Sig Start Date End Date Taking? Authorizing Provider  folic acid (FOLVITE) 1 MG tablet Take 1 tablet (1 mg total) by mouth daily. Patient not taking: Reported on 04/19/2016 06/22/15   Ryan Mclean, MD  Multiple Vitamin (MULTIVITAMIN WITH MINERALS) TABS tablet Take 1 tablet by  mouth daily. Patient not taking: Reported on 04/19/2016 06/22/15   Ryan Mclean, MD  thiamine 100 MG tablet Take 1 tablet (100 mg total) by mouth daily. Patient not taking: Reported on 04/19/2016 06/22/15   Ryan Mclean, MD    Physical Exam: Vitals:   04/19/16 0422 04/19/16 0445 04/19/16 0455 04/19/16 0600  BP: 146/96 138/98 138/98 (!) 157/109  Pulse: 84 76 88 73  Resp: 24 19    Temp:  98 F (36.7 C)    TempSrc:  Oral    SpO2: 100% 100%  100%  Weight:      Height:        Constitutional:  Well developed Hispanic male restless in bed Vitals:   04/19/16 0422 04/19/16 0445 04/19/16 0455 04/19/16 0600  BP: 146/96 138/98 138/98 (!) 157/109  Pulse: 84 76 88 73  Resp: 24 19    Temp:  98 F (36.7 C)    TempSrc:  Oral    SpO2: 100% 100%  100%  Weight:      Height:       Eyes: PER, lids and conjunctivae normal ENMT: Mucous membranes are moist. Posterior pharynx clear of any exudate or lesions..  Neck: normal, supple, no masses Respiratory: clear to auscultation bilaterally, no wheezing, no crackles. Normal respiratory effort. No accessory muscle use.  Cardiovascular: Sinus tachycardia. No murmurs.  No extremity edema.  2+ pedal pulses.   Abdomen: no tenderness, no masses palpated. No hepatomegaly. Bowel sounds positive.  Musculoskeletal: no clubbing / cyanosis. No joint deformity upper and lower extremities. Good ROM, no contractures. Normal muscle tone.  Skin: no rashes, lesions, ulcers.  Neurologic: Alert, not oriented to person, place nor time.   Psychiatric: Restless, some agitation.    Labs on Admission: I have personally reviewed following labs and imaging studies  Radiological Exams on Admission: No results found.  Assessment/Plan   Alcohol abuse with intoxication delirium (HCC). Patient required ICU / Precedex last admission. He is agitated, tachycardic, hypertensive and having hallucinations. Stable respiratory status -Admission pending, Stepdown vrs ICU (awaiting CCM  evaluation).  -Patient trying to standup in bed, at risk for harming himself. He has just been placed in 4 point soft restraints.  -CCM consulted  -CIWA - ICU protocol -thiamine, folate replacement -Clonidine patch -EKG and if okay then Haldol x1 -Mg+ 2gms -prn Lopressor -Needs Social work consult prior to admission. Remind patient of community resources to help with alcoholism.     Thrombocytopenia, chronic. Platelets 112. Bone marrow suppression secondary to ETOH?  .             DVT prophylaxis:   Lovenox   Code Status:     Full code  Family Communication:    Treatment plan discussed with wife in the room and she understands and agrees with the plan..  Disposition Plan:   Discharge home in 2-3 day              Consults called:  CCM - Dr. Tyson Alias.   Admission status:    Admission - Stepdown vrs ICU pending CCM evaluation  Willette Cluster NP [facility] Pager [phone]  If 7PM-7AM, please contact night-coverage [email or web address] Password [email or web address]  04/19/2016, 8:02 AM

## 2016-04-20 DIAGNOSIS — D696 Thrombocytopenia, unspecified: Secondary | ICD-10-CM

## 2016-04-20 LAB — PHOSPHORUS: Phosphorus: 2.9 mg/dL (ref 2.5–4.6)

## 2016-04-20 LAB — COMPREHENSIVE METABOLIC PANEL
ALBUMIN: 3.7 g/dL (ref 3.5–5.0)
ALT: 66 U/L — ABNORMAL HIGH (ref 17–63)
ANION GAP: 8 (ref 5–15)
AST: 97 U/L — AB (ref 15–41)
Alkaline Phosphatase: 47 U/L (ref 38–126)
CO2: 27 mmol/L (ref 22–32)
Calcium: 8.7 mg/dL — ABNORMAL LOW (ref 8.9–10.3)
Chloride: 101 mmol/L (ref 101–111)
Creatinine, Ser: 0.5 mg/dL — ABNORMAL LOW (ref 0.61–1.24)
GFR calc Af Amer: >60 mL/min (ref >60)
GFR calc non Af Amer: >60 mL/min (ref >60)
GLUCOSE: 127 mg/dL — AB (ref 65–99)
POTASSIUM: 3.4 mmol/L — AB (ref 3.5–5.1)
SODIUM: 136 mmol/L (ref 135–145)
Total Bilirubin: 1 mg/dL (ref 0.3–1.2)
Total Protein: 6.5 g/dL (ref 6.5–8.1)

## 2016-04-20 LAB — CK
Total CK: 2540 U/L — ABNORMAL HIGH (ref 49–397)
Total CK: 2949 U/L — ABNORMAL HIGH (ref 49–397)
Total CK: 4207 U/L — ABNORMAL HIGH (ref 49–397)

## 2016-04-20 LAB — GLUCOSE, CAPILLARY
GLUCOSE-CAPILLARY: 124 mg/dL — AB (ref 65–99)
GLUCOSE-CAPILLARY: 133 mg/dL — AB (ref 65–99)
Glucose-Capillary: 134 mg/dL — ABNORMAL HIGH (ref 65–99)
Glucose-Capillary: 132 mg/dL — ABNORMAL HIGH (ref 65–99)
Glucose-Capillary: 114 mg/dL — ABNORMAL HIGH (ref 65–99)

## 2016-04-20 LAB — BASIC METABOLIC PANEL
ANION GAP: 4 — AB (ref 5–15)
BUN: <5 mg/dL — ABNORMAL LOW (ref 6–20)
CALCIUM: 8.7 mg/dL — AB (ref 8.9–10.3)
CHLORIDE: 108 mmol/L (ref 101–111)
CO2: 26 mmol/L (ref 22–32)
CREATININE: 0.59 mg/dL — AB (ref 0.61–1.24)
GFR calc non Af Amer: >60 mL/min (ref >60)
Glucose, Bld: 127 mg/dL — ABNORMAL HIGH (ref 65–99)
Potassium: 3.5 mmol/L (ref 3.5–5.1)
SODIUM: 138 mmol/L (ref 135–145)

## 2016-04-20 LAB — CBC
HCT: 42.2 % (ref 39.0–52.0)
Hemoglobin: 15.2 g/dL (ref 13.0–17.0)
MCH: 37 pg — ABNORMAL HIGH (ref 26.0–34.0)
MCHC: 36 g/dL (ref 30.0–36.0)
MCV: 102.7 fL — ABNORMAL HIGH (ref 78.0–100.0)
Platelets: 120 10*3/uL — ABNORMAL LOW (ref 150–400)
RBC: 4.11 MIL/uL — ABNORMAL LOW (ref 4.22–5.81)
RDW: 13 % (ref 11.5–15.5)
WBC: 10.6 10*3/uL — ABNORMAL HIGH (ref 4.0–10.5)

## 2016-04-20 LAB — MAGNESIUM: Magnesium: 1.7 mg/dL (ref 1.7–2.4)

## 2016-04-20 MED ORDER — KCL IN DEXTROSE-NACL 20-5-0.45 MEQ/L-%-% IV SOLN
INTRAVENOUS | Status: DC
  Administered 2016-04-20 – 2016-04-23 (×7): via INTRAVENOUS
  Filled 2016-04-20 (×12): qty 1000

## 2016-04-20 MED ORDER — DEXMEDETOMIDINE HCL IN NACL 400 MCG/100ML IV SOLN
0.2000 ug/kg/h | INTRAVENOUS | Status: DC
  Administered 2016-04-20: 1.2 ug/kg/h via INTRAVENOUS
  Administered 2016-04-20: 1 ug/kg/h via INTRAVENOUS
  Administered 2016-04-20: 1.1 ug/kg/h via INTRAVENOUS
  Administered 2016-04-21: 0.8 ug/kg/h via INTRAVENOUS
  Filled 2016-04-20 (×4): qty 100

## 2016-04-20 MED ORDER — POTASSIUM CHLORIDE 10 MEQ/100ML IV SOLN
10.0000 meq | INTRAVENOUS | Status: AC
Start: 2016-04-20 — End: 2016-04-20
  Administered 2016-04-20 (×2): 10 meq via INTRAVENOUS
  Filled 2016-04-20 (×2): qty 100

## 2016-04-20 MED ORDER — MAGNESIUM SULFATE 2 GM/50ML IV SOLN
2.0000 g | Freq: Once | INTRAVENOUS | Status: AC
  Administered 2016-04-20: 2 g via INTRAVENOUS
  Filled 2016-04-20: qty 50

## 2016-04-20 NOTE — Progress Notes (Signed)
CSW following for substance abuse consult. Patient has remained agitated requiring increase in precedex. Patient is confused, hallucinating, extremely agitated and cannot provide history. CSW to discuss substance abuse resource options with Patient once more medically appropriate. CSW has provided Patient's wife with a work note as requested by Patient's RN and substance abuse resources. CSW continuing to follow.          Lance Muss, LCSW [facility] ED/60M Clinical Social Worker [phone]

## 2016-04-20 NOTE — Progress Notes (Signed)
PULMONARY / CRITICAL CARE MEDICINE   Name: Ryan Greer MRN: [id number] DOB: 01-04-1981    ADMISSION DATE:  04/19/2016   CONSULTATION DATE:  04/19/2016  REFERRING MD:  Dr Shelly Flatten  CHIEF COMPLAINT: Acute alcohol withdrawal  HISTORY OF PRESENT ILLNESS:   Ryan Greer is a 35 y.o. male with a medical history significant for etoh abuse. He has drank heavily since age 30. Patient tried to quit drinking last year at which time he required ICU admission for precedex. Patient normally drinks about 18 beers a day. He decided to stop drinking a few days ago. He had only one drink on Sunday and by Sunday evening / Monday patient began shaking and had at least one seizure per wife. Following the seizure patient had an episode of vomiting and diarrhea. Patient is confused, hallucinating, extremely agitated and cannot provide history.  SUBJECTIVE:  Agitated overnight, requiring increase in precedex.  VITAL SIGNS: BP (!) 138/106   Pulse 67   Temp 98.3 F (36.8 C) (Oral)   Resp 19   Ht  (1.676 m)   Wt 74.8 kg (164 lb 14.5 oz)   SpO2 97%   BMI 26.62 kg/m   HEMODYNAMICS:    VENTILATOR SETTINGS:    INTAKE / OUTPUT: I/O last 3 completed shifts: In: 3520.4 [I.V.:3470.4; IV Piggyback:50] Out: 3425 [Urine:3425]  PHYSICAL EXAMINATION: General: Middle aged male comfortable in bed and talking in spanish. Neuro: Sedate in bed, arousable and cooperative.  HEENT: Moist mucous membranes.  Cardiovascular: S1S2, tachycardic. No murmurs Lungs: Clear hroughout Abdomen: Non-distended, no hepatomegaly Musculoskeletal: Age appropriate tone and bulk Skin: No visible rashes  LABS:  BMET  Recent Labs Lab 04/19/16 1649 04/19/16 2354 04/20/16 0550  NA 138 138 136  K 3.7 3.5 3.4*  CL 108 108 101  CO2 BUN <5* <5* <5*  CREATININE 0.59* 0.59* 0.50*  GLUCOSE 116* 127* 127*    Electrolytes  Recent Labs Lab 04/19/16 0035 04/19/16 0807  04/19/16 1649 04/19/16 2354  04/20/16 0550 04/20/16 1027  CALCIUM 9.9  --   < > 8.4* 8.7* 8.7*  --   MG  --  1.7  --   --   --   --  1.7  PHOS 3.8  --   --   --   --   --  2.9  < > = values in this interval not displayed.  CBC  Recent Labs Lab 04/19/16 0035 04/19/16 1042 04/20/16 0550  WBC 8.2 6.4 10.6*  HGB 14.1 13.0 15.2  HCT 39.3 36.7* 42.2  PLT 112* 97* 120*   Coag's  Recent Labs Lab 04/19/16 1042  APTT 27  INR 1.18   Sepsis Markers  Recent Labs Lab 04/19/16 0807  LATICACIDVEN 1.3   ABG  Recent Labs Lab 04/19/16 1224  PHART 7.332*  PCO2ART 44.4  PO2ART 93.0   Liver Enzymes  Recent Labs Lab 04/19/16 0035 04/20/16 0550  AST 78* 97*  ALT 52 66*  ALKPHOS 54 47  BILITOT 0.4 1.0  ALBUMIN 4.3 3.7   Cardiac Enzymes No results for input(s): TROPONINI, PROBNP in the last 168 hours.  Glucose  Recent Labs Lab 04/19/16 1126 04/19/16 1604 04/20/16 0013 04/20/16 0726  GLUCAP 138* 116* 124* 132*   Imaging No results found.  STUDIES:  None  CULTURES: None  ANTIBIOTICS: None  SIGNIFICANT EVENTS: 8/2: Admitted to ICU with PCCM consult for management of acute ETOH withdrawal delirium. Currently sedated on Precedex drip.   LINES/TUBES: PIV  x 2 in bilateral lower extremities  DISCUSSION: 35 yo  hispanic male with extensive drinking history. He began drinking around the age of 25 and currently drinks approximately 18 beers per day. He was admitted last year for ETOH withdrawal and was admitted to the ICU and sedated on Precedex drip. Wife state he took his last drink on Sunday 7/30, and began shaking that evening. She reports seizure like activity, with vomiting and diarrhea. He is currently sedated on  Precedex drip, but may require intubation for control. PCCM is consulted for management. Plan is to admit to ICU, continue Precedex and Ativan for sedation, high risk for intubation given extreme agitation. Continue thiamine and folate. Monitor CK Q6 hours and give fluid  resuscitation as CK very elevated on admission labs.   ASSESSMENT / PLAN:  PULMONARY A: Monitor for need of intubation  P:   Monitor closely for airway protection. High dose precedex and PRN Ativan Monitor work of breathing, ability to protect airway and O2 sats O2 as needed to maintain sats > 92%  CARDIOVASCULAR A:  Tachycardia Hypertension  P:  Tachycardia improved once adequately sedated Hydralazine and Labetalol PRN for HTN Continuous cardiac monitoring Monitor for bradycardia in the setting of high dose precedex  RENAL A:   Rhabdomyolysis CK 2949  P:   Foley for accurate I&O Change IVF to D5 1/2NS with 20 meq of K at 100 ml/hr. CPK in AM. BMET at 5 PM then in AM. Replace electrolytes as indicated.   GASTROINTESTINAL A:   NPO Elevated liver enzymes  P:   Pepcid for GI prophylaxis while NPO Will evaluate ability to eat as delirium progresses May need NGT for meds and enteral nutrition if no improvement by AM  HEMATOLOGIC A:   Thrombocytopenia DVT prophylaxis  P:  May be reactive given acute state Monitor for signs/Symptoms of bleeding >> no acute signs Transfuse for HgB < 7 Lovenox for DVT prophylaxis SCD  INFECTIOUS A:   No acute issues  P:   Continue to monitor for signs of infection Follow CBC and fever curve   ENDOCRINE A:   No acute issues  P:   CBG q 4 hours while NPO Monitor for hypoglycemia  NEUROLOGIC A:   Acute ETOH withdrawal Delirium  P:   RASS goal: 0 Frequent neuro checks Continue high dose Precedex and PRN Ativan Continue high dose Thiamine and Folate  FAMILY  - Updates: No family bedside 8/3.  Continue precedex for today.  - Inter-disciplinary family meet or Palliative Care meeting due by: 04/26/2016  The patient is critically ill with multiple organ systems failure and requires high complexity decision making for assessment and support, frequent evaluation and titration of therapies, application of advanced  monitoring technologies and extensive interpretation of multiple databases.   Critical Care Time devoted to patient care services described in this note is  35  Minutes. This time reflects time of care of this signee Dr Koren Bound. This critical care time does not reflect procedure time, or teaching time or supervisory time of PA/NP/Med student/Med Resident etc but could involve care discussion time.  Alyson Reedy, M.D. [facility] Pulmonary/Critical Care Medicine. Pager: [phone]. After hours pager: [phone].

## 2016-04-20 NOTE — Care Management Note (Signed)
Case Management Note  Patient Details  Name: Edison Colvard MRN: [id number] Date of Birth: 01-18-1981  Subjective/Objective: Pt admitted with alcohol withdrawl                   Action/Plan:  Pt is from home with wife.  CSW consulted for substance abuse.  CM will continue to monitor for discharge needs   Expected Discharge Date:                  Expected Discharge Plan:  Home/Self Care  In-House Referral:  Clinical Social Work  Discharge planning Services     Post Acute Care Choice:    Choice offered to:     DME Arranged:    DME Agency:     HH Arranged:    HH Agency:     Status of Service:  In process, will continue to follow  If discussed at Long Length of Stay Meetings, dates discussed:    Additional Comments:  Cherylann Parr, RN 04/20/2016, 10:34 AM

## 2016-04-21 LAB — CBC
HEMATOCRIT: 41.2 % (ref 39.0–52.0)
HEMOGLOBIN: 14.4 g/dL (ref 13.0–17.0)
MCH: 35.6 pg — AB (ref 26.0–34.0)
MCHC: 35 g/dL (ref 30.0–36.0)
MCV: 101.7 fL — AB (ref 78.0–100.0)
Platelets: 146 10*3/uL — ABNORMAL LOW (ref 150–400)
RBC: 4.05 MIL/uL — AB (ref 4.22–5.81)
RDW: 12.6 % (ref 11.5–15.5)
WBC: 8 10*3/uL (ref 4.0–10.5)

## 2016-04-21 LAB — BASIC METABOLIC PANEL
Anion gap: 6 (ref 5–15)
CHLORIDE: 102 mmol/L (ref 101–111)
CO2: 27 mmol/L (ref 22–32)
Calcium: 8.8 mg/dL — ABNORMAL LOW (ref 8.9–10.3)
Creatinine, Ser: 0.72 mg/dL (ref 0.61–1.24)
GFR calc Af Amer: >60 mL/min (ref >60)
GFR calc non Af Amer: >60 mL/min (ref >60)
Glucose, Bld: 115 mg/dL — ABNORMAL HIGH (ref 65–99)
POTASSIUM: 3.7 mmol/L (ref 3.5–5.1)
SODIUM: 135 mmol/L (ref 135–145)

## 2016-04-21 LAB — GLUCOSE, CAPILLARY
GLUCOSE-CAPILLARY: 111 mg/dL — AB (ref 65–99)
GLUCOSE-CAPILLARY: 133 mg/dL — AB (ref 65–99)
GLUCOSE-CAPILLARY: 178 mg/dL — AB (ref 65–99)
GLUCOSE-CAPILLARY: 93 mg/dL (ref 65–99)
Glucose-Capillary: 141 mg/dL — ABNORMAL HIGH (ref 65–99)
Glucose-Capillary: 106 mg/dL — ABNORMAL HIGH (ref 65–99)

## 2016-04-21 LAB — PHOSPHORUS: PHOSPHORUS: 3.4 mg/dL (ref 2.5–4.6)

## 2016-04-21 LAB — MAGNESIUM: MAGNESIUM: 2.1 mg/dL (ref 1.7–2.4)

## 2016-04-21 LAB — CK: Total CK: 2496 U/L — ABNORMAL HIGH (ref 49–397)

## 2016-04-21 MED ORDER — LORAZEPAM 2 MG/ML IJ SOLN
0.0000 mg | Freq: Four times a day (QID) | INTRAMUSCULAR | Status: DC
  Administered 2016-04-21: 1 mg via INTRAVENOUS
  Administered 2016-04-21 – 2016-04-22 (×2): 2 mg via INTRAVENOUS
  Administered 2016-04-22: 1 mg via INTRAVENOUS
  Filled 2016-04-21 (×3): qty 1

## 2016-04-21 MED ORDER — LORAZEPAM 2 MG/ML IJ SOLN: 0.0000 mg | Freq: Two times a day (BID) | INTRAMUSCULAR | Status: DC

## 2016-04-21 MED ORDER — LORAZEPAM 2 MG/ML IJ SOLN
1.0000 mg | Freq: Four times a day (QID) | INTRAMUSCULAR | Status: DC | PRN
  Filled 2016-04-21: qty 1

## 2016-04-21 MED ORDER — LORAZEPAM 1 MG PO TABS
1.0000 mg | ORAL_TABLET | Freq: Four times a day (QID) | ORAL | Status: DC | PRN
  Administered 2016-04-21: 1 mg via ORAL
  Filled 2016-04-21: qty 1

## 2016-04-21 NOTE — Progress Notes (Signed)
PULMONARY / CRITICAL CARE MEDICINE   Name: Ryan Greer MRN: [id number] DOB: 1981-09-03    ADMISSION DATE:  04/19/2016   CONSULTATION DATE:  04/19/2016  REFERRING MD:  Dr Shelly Flatten  CHIEF COMPLAINT: Acute alcohol withdrawal  HISTORY OF PRESENT ILLNESS:   Ryan Greer is a 35 y.o. male with a medical history significant for etoh abuse. He has drank heavily since age 64. Patient tried to quit drinking last year at which time he required ICU admission for precedex. Patient normally drinks about 18 beers a day. He decided to stop drinking a few days ago. He had only one drink on Sunday and by Sunday evening / Monday patient began shaking and had at least one seizure per wife. Following the seizure patient had an episode of vomiting and diarrhea. Patient is confused, hallucinating, extremely agitated and cannot provide history.  SUBJECTIVE:  Calm, weaning precedex   VITAL SIGNS: BP (!) 134/91   Pulse 70   Temp 98.4 F (36.9 C)   Resp 16   Ht 5\' 6"  (1.676 m)   Wt 166 lb 3.6 oz (75.4 kg)   SpO2 100%   BMI 26.83 kg/m  Room air     INTAKE / OUTPUT: I/O last 3 completed shifts: In: 6726.7 [P.O.:1020; I.V.:5356.7; IV Piggyback:350] Out: 5475 [Urine:5475]  PHYSICAL EXAMINATION: General: Middle aged male comfortable in bed and talking in spanish. Calm and in no distress.  Neuro: Awake, cooperative. No focal motor def  HEENT: Moist mucous membranes. No JVD   Cardiovascular: S1S2, tachycardic. No murmurs Lungs: Clear; no accessory use Abdomen: Non-distended, no hepatomegaly + bowel sounds  Musculoskeletal: Age appropriate tone and bulk Skin: No visible rashes  LABS:  BMET  Recent Labs Lab 04/19/16 2354 04/20/16 0550 04/21/16 0233  NA 138 136 135  K 3.5 3.4* 3.7  CL 108 101 102  CO2 26 27 27   BUN <5* <5* <5*  CREATININE 0.59* 0.50* 0.72  GLUCOSE 127* 127* 115*    Electrolytes  Recent Labs Lab 04/19/16 0035 04/19/16 0807  04/19/16 2354 04/20/16 0550  04/20/16 1027 04/21/16 0233  CALCIUM 9.9  --   < > 8.7* 8.7*  --  8.8*  MG  --  1.7  --   --   --  1.7 2.1  PHOS 3.8  --   --   --   --  2.9 3.4  < > = values in this interval not displayed.  CBC  Recent Labs Lab 04/19/16 1042 04/20/16 0550 04/21/16 0233  WBC 6.4 10.6* 8.0  HGB 13.0 15.2 14.4  HCT 36.7* 42.2 41.2  PLT 97* 120* 146*   Coag's  Recent Labs Lab 04/19/16 1042  APTT 27  INR 1.18   Sepsis Markers  Recent Labs Lab 04/19/16 0807  LATICACIDVEN 1.3   ABG  Recent Labs Lab 04/19/16 1224  PHART 7.332*  PCO2ART 44.4  PO2ART 93.0   Liver Enzymes  Recent Labs Lab 04/19/16 0035 04/20/16 0550  AST 78* 97*  ALT 52 66*  ALKPHOS 54 47  BILITOT 0.4 1.0  ALBUMIN 4.3 3.7   Cardiac Enzymes No results for input(s): TROPONINI, PROBNP in the last 168 hours.  Glucose  Recent Labs Lab 04/20/16 1137 04/20/16 1542 04/20/16 2021 04/20/16 2343 04/21/16 0412 04/21/16 0845  GLUCAP 114* 133* 178* 133* 111* 141*   Imaging No results found.  STUDIES:  None  CULTURES: None  ANTIBIOTICS: None  SIGNIFICANT EVENTS: 8/2: Admitted to ICU with PCCM consult for management of acute ETOH  withdrawal delirium. Currently sedated on Precedex drip.  8/4 better. Precedex weaning off   LINES/TUBES: PIV x 2 in bilateral lower extremities  DISCUSSION: 35 yo  hispanic male with extensive drinking history. Admitted w/ DTs on 8/2. As of 8/4 he is now cooperative and calm. We are weaning his precedex. His CKs were elevated on admit these are trending down. For today the plan is to: dc precedex; switch to CIWA, cont supportive care. We will f/u LFTs and total CPKs in am. Can likely go the the SDU later this afternoon if tolerates being off precedex.   ASSESSMENT / PLAN:  Acute ETOH withdrawal Delirium Tremens  Plan   Cont clonidine  DC precedex; transition to CIWA Continue high dose Thiamine and Folate  Tachycardia & Hypertension in setting of wd-->stable Plan:   Hydralazine and Labetalol PRN for HTN Continuous cardiac monitoring while on precedex; can stop this once precedex off   Mild Rhabdomyolysis w/out AKI CPKs trending down Plan:   CPK & chem in AM. Cont IVFs at current   Elevated liver enzymes Plan   Cont regular diet  Repeat LFTs in am   Thrombocytopenia-->improved  DVT prophylaxis plan:  Monitor for signs/Symptoms of bleeding  Transfuse for HgB < 7 Lovenox for DVT prophylaxis  FAMILY  - Updates: No family bedside 8/3.  Continue precedex for today.  - Inter-disciplinary family meet or Palliative Care meeting due by: 04/26/2016  Simonne Martinet ACNP-BC [facility] Pulmonary/Critical Care Pager # [phone] OR # [phone] if no answer  Attending Note:  35 year old alcoholic male in DT's with alcohol withdrawal.  In ICU for precedex drip.  DT symptoms improving but continues to be on a precedex drip.  On exam, the patient is interactive but does not speak english.  Lungs are clear to auscultation. I reviewed CXR myself, no acute disease.  Will hold in the ICU until able to get off precedex then will consider moving out of the ICU to SDU and back to [facility].  The patient is critically ill with multiple organ systems failure and requires high complexity decision making for assessment and support, frequent evaluation and titration of therapies, application of advanced monitoring technologies and extensive interpretation of multiple databases.   Critical Care Time devoted to patient care services described in this note is  35  Minutes. This time reflects time of care of this signee Dr Koren Bound. This critical care time does not reflect procedure time, or teaching time or supervisory time of PA/NP/Med student/Med Resident etc but could involve care discussion time.  Alyson Reedy, M.D. [facility] Pulmonary/Critical Care Medicine. Pager: [phone]. After hours pager: [phone].

## 2016-04-21 NOTE — Progress Notes (Signed)
eLink Physician-Brief Progress Note Patient Name: Ryan Greer DOB: April 25, 1981 MRN: [id number]   Date of Service  04/21/2016  HPI/Events of Note  Off precedex Going to SDU  eICU Interventions  D/c precedex orderset Continue CIWA orderset     Intervention Category Minor Interventions: Routine modifications to care plan (e.g. PRN medications for pain, fever)  Max Fickle 04/21/2016, 4:30 PM

## 2016-04-22 DIAGNOSIS — R740 Nonspecific elevation of levels of transaminase and lactic acid dehydrogenase [LDH]: Secondary | ICD-10-CM

## 2016-04-22 LAB — GLUCOSE, CAPILLARY
GLUCOSE-CAPILLARY: 110 mg/dL — AB (ref 65–99)
GLUCOSE-CAPILLARY: 116 mg/dL — AB (ref 65–99)
GLUCOSE-CAPILLARY: 137 mg/dL — AB (ref 65–99)
GLUCOSE-CAPILLARY: 154 mg/dL — AB (ref 65–99)
GLUCOSE-CAPILLARY: 197 mg/dL — AB (ref 65–99)
Glucose-Capillary: 142 mg/dL — ABNORMAL HIGH (ref 65–99)
Glucose-Capillary: 117 mg/dL — ABNORMAL HIGH (ref 65–99)

## 2016-04-22 LAB — COMPREHENSIVE METABOLIC PANEL
ALT: 49 U/L (ref 17–63)
AST: 67 U/L — AB (ref 15–41)
Albumin: 3.7 g/dL (ref 3.5–5.0)
Alkaline Phosphatase: 47 U/L (ref 38–126)
Anion gap: 10 (ref 5–15)
BILIRUBIN TOTAL: 0.6 mg/dL (ref 0.3–1.2)
CO2: 23 mmol/L (ref 22–32)
CREATININE: 0.65 mg/dL (ref 0.61–1.24)
Calcium: 9.2 mg/dL (ref 8.9–10.3)
Chloride: 102 mmol/L (ref 101–111)
GFR calc Af Amer: >60 mL/min (ref >60)
Glucose, Bld: 136 mg/dL — ABNORMAL HIGH (ref 65–99)
Potassium: 3 mmol/L — ABNORMAL LOW (ref 3.5–5.1)
Sodium: 135 mmol/L (ref 135–145)
TOTAL PROTEIN: 6.8 g/dL (ref 6.5–8.1)

## 2016-04-22 LAB — CK TOTAL AND CKMB (NOT AT ARMC)
CK TOTAL: 2140 U/L — AB (ref 49–397)
CK, MB: 5.7 ng/mL — ABNORMAL HIGH (ref 0.5–5.0)
Relative Index: 0.3 (ref 0.0–2.5)

## 2016-04-22 MED ORDER — ACETAMINOPHEN 500 MG PO TABS: 500.0000 mg | ORAL_TABLET | Freq: Four times a day (QID) | ORAL | Status: DC | PRN

## 2016-04-22 MED ORDER — LORAZEPAM 2 MG/ML IJ SOLN
2.0000 mg | INTRAMUSCULAR | Status: DC | PRN
  Administered 2016-04-23: 2 mg via INTRAVENOUS
  Filled 2016-04-22: qty 1

## 2016-04-22 MED ORDER — ACETAMINOPHEN 650 MG RE SUPP: 325.0000 mg | Freq: Four times a day (QID) | RECTAL | Status: DC | PRN

## 2016-04-22 MED ORDER — POTASSIUM CHLORIDE CRYS ER 20 MEQ PO TBCR
40.0000 meq | EXTENDED_RELEASE_TABLET | ORAL | Status: AC
  Administered 2016-04-22 (×2): 40 meq via ORAL
  Filled 2016-04-22 (×2): qty 2

## 2016-04-22 NOTE — Progress Notes (Signed)
CM received call from RN requesting CSW to speak with wife of pt for placement for rehab.  CM referred call to CSW, [phone].

## 2016-04-22 NOTE — Progress Notes (Signed)
eLink Physician-Brief Progress Note Patient Name: Ryan Greer DOB: 12-Mar-1981 MRN: [id number]   Date of Service  04/22/2016  HPI/Events of Note  Hypokalemia  eICU Interventions  Potassium replaced     Intervention Category Intermediate Interventions: Electrolyte abnormality - evaluation and management  Razia Screws 04/22/2016, 4:52 AM

## 2016-04-22 NOTE — Progress Notes (Signed)
[facility] TEAM 1 - Stepdown/ICU TEAM  Ryan Greer  WNU:[id number] DOB: 18-Apr-1981 DOA: 04/19/2016 PCP: No PCP Per Patient    Brief Narrative:  35 y.o. male with a history of EtOH abuse (has drank heavily since age 54). Patient tried to quit drinking last year at which time he required ICU admission for precedex. He again decided to stop drinking a few days prior to this admit. Within 24-36hrs he began shaking and had at least one seizure per his wife. Following the seizure patient had an episode of vomiting and diarrhea. Patient was confused, hallucinating, and agitated at presentation.   Subjective: The pt is resting comfortably at present.  His RN reports that he required regular dosing of ativan last night, but that he has been resting and not agitated this AM.  No evidence of respiratory distress or pain.     Assessment & Plan:  EtOH abuse w/ acute delirium tremens / EtOH withdrawal Cont CIWA protocol - appears to be improving   Thrombocytopenia Due to above - follow - no evidence of acute blood loss   Hypokalemia  Mg is normal - replace and follow   HTN BP currently well controlled   Rhabdomyolysis  CK slowly improving w/ hydration   Transaminitis  Improving - check viral hepatitis panel to be complete   DVT prophylaxis: lovenox Code Status: FULL CODE Family Communication: no family present at time of exam  Disposition Plan: SDU   Consultants:  PCCM  Procedures: none  Antimicrobials:  none  Objective: Blood pressure 120/76, pulse 90, temperature 99.1 F (37.3 C), temperature source Oral, resp. rate (!) 21, height  (1.676 m), weight 72.2 kg (159 lb 2.8 oz), SpO2 99 %.  Intake/Output Summary (Last 24 hours) at 04/22/16 1020 Last data filed at 04/22/16 1000  Gross per 24 hour  Intake             3732 ml  Output             6175 ml  Net            -2443 ml   Filed Weights   04/20/16 0345 04/21/16 0405 04/22/16 0500  Weight: 74.8 kg (164 lb 14.5 oz)  75.4 kg (166 lb 3.6 oz) 72.2 kg (159 lb 2.8 oz)    Examination: General: No acute respiratory distress Lungs: Clear to auscultation bilaterally without wheezes or crackles Cardiovascular: Regular rate and rhythm without murmur gallop or rub normal S1 and S2 Abdomen: Nontender, nondistended, soft, bowel sounds positive, no rebound, no ascites, no appreciable mass Extremities: No significant cyanosis, clubbing, or edema bilateral lower extremities  CBC:  Recent Labs Lab 04/19/16 0035 04/19/16 1042 04/20/16 0550 04/21/16 0233  WBC 8.2 6.4 10.6* 8.0  HGB 14.1 13.0 15.2 14.4  HCT 39.3 36.7* 42.2 41.2  MCV 101.8* 101.1* 102.7* 101.7*  PLT 112* 97* 120* 146*   Basic Metabolic Panel:  Recent Labs Lab 04/19/16 0035 04/19/16 0807  04/19/16 1649 04/19/16 2354 04/20/16 0550 04/20/16 1027 04/21/16 0233 04/22/16 0236  NA 136  --   < > 138 138 136  --  135 135  K 3.7  --   < > 3.7 3.5 3.4*  --  3.7 3.0*  CL 100*  --   < > 108 108 101  --  102 102  CO2 27  --   < > --  27 23  GLUCOSE 95  --   < > 116* 127* 127*  --  115* 136*  BUN 6  --   < > <5* <5* <5*  --  <5* <5*  CREATININE 0.71  --   < > 0.59* 0.59* 0.50*  --  0.72 0.65  CALCIUM 9.9  --   < > 8.4* 8.7* 8.7*  --  8.8* 9.2  MG  --  1.7  --   --   --   --  1.7 2.1  --   PHOS 3.8  --   --   --   --   --  2.9 3.4  --   < > = values in this interval not displayed.   GFR: Estimated Creatinine Clearance: 117.4 mL/min (by C-G formula based on SCr of 0.8 mg/dL).  Liver Function Tests:  Recent Labs Lab 04/19/16 0035 04/20/16 0550 04/22/16 0236  AST 78* 97* 67*  ALT 52 66* 49  ALKPHOS 54 47 47  BILITOT 0.4 1.0 0.6  PROT 6.8 6.5 6.8  ALBUMIN 4.3 3.7 3.7    Recent Labs Lab 04/19/16 1042  LIPASE 57*  AMYLASE 87    Recent Labs Lab 04/19/16 1042  AMMONIA 43*    Coagulation Profile:  Recent Labs Lab 04/19/16 1042  INR 1.18    Cardiac Enzymes:  Recent Labs Lab 04/19/16 2354 04/20/16 0550  04/20/16 0906 04/21/16 0233 04/22/16 0719  CKTOTAL 4,207* 2,949* 2,540* 2,496* 2,140*  CKMB  --   --   --   --  5.7*    CBG:  Recent Labs Lab 04/21/16 1528 04/21/16 2100 04/22/16 0035 04/22/16 0442 04/22/16 0916  GLUCAP 106* 142* 110* 117* 154*    Recent Results (from the past 240 hour(s))  MRSA PCR Screening     Status: None   Collection Time: 04/19/16 11:35 AM  Result Value Ref Range Status   MRSA by PCR NEGATIVE NEGATIVE Final    Comment:        The GeneXpert MRSA Assay (FDA approved for NASAL specimens only), is one component of a comprehensive MRSA colonization surveillance program. It is not intended to diagnose MRSA infection nor to guide or monitor treatment for MRSA infections.      Scheduled Meds: . cloNIDine  0.3 mg Transdermal Weekly  . enoxaparin (LOVENOX) injection  40 mg Subcutaneous Q24H  . folic acid  1 mg Oral Daily  . LORazepam  0-4 mg Intravenous Q6H   Followed by  . [START ON 04/23/2016] LORazepam  0-4 mg Intravenous Q12H  . multivitamin with minerals  1 tablet Oral Daily  . potassium chloride  40 mEq Oral Q4H  . sodium chloride flush  3 mL Intravenous Q12H  . thiamine  100 mg Oral Daily   Or  . thiamine  100 mg Intravenous Daily     LOS: 3 days   Lonia Blood, MD [facility] Office  [phone] Pager - Text Page per Loretha Stapler as per below:  On-Call/Text Page:      [email or web address]      password [email or web address]  If 7PM-7AM, please contact night-coverage [email or web address] Password [email or web address] 04/22/2016, 10:20 AM

## 2016-04-23 LAB — CBC
HEMATOCRIT: 40.5 % (ref 39.0–52.0)
Hemoglobin: 14.4 g/dL (ref 13.0–17.0)
MCH: 35.9 pg — ABNORMAL HIGH (ref 26.0–34.0)
MCHC: 35.6 g/dL (ref 30.0–36.0)
MCV: 101 fL — AB (ref 78.0–100.0)
PLATELETS: 217 10*3/uL (ref 150–400)
RBC: 4.01 MIL/uL — AB (ref 4.22–5.81)
RDW: 12.2 % (ref 11.5–15.5)
WBC: 6 10*3/uL (ref 4.0–10.5)

## 2016-04-23 LAB — COMPREHENSIVE METABOLIC PANEL
ALK PHOS: 46 U/L (ref 38–126)
ALT: 42 U/L (ref 17–63)
ANION GAP: 7 (ref 5–15)
AST: 39 U/L (ref 15–41)
Albumin: 3.7 g/dL (ref 3.5–5.0)
BILIRUBIN TOTAL: 0.7 mg/dL (ref 0.3–1.2)
BUN: <5 mg/dL — ABNORMAL LOW (ref 6–20)
CO2: 27 mmol/L (ref 22–32)
CREATININE: 0.66 mg/dL (ref 0.61–1.24)
Calcium: 9.6 mg/dL (ref 8.9–10.3)
Chloride: 100 mmol/L — ABNORMAL LOW (ref 101–111)
Glucose, Bld: 125 mg/dL — ABNORMAL HIGH (ref 65–99)
Potassium: 4.1 mmol/L (ref 3.5–5.1)
Sodium: 134 mmol/L — ABNORMAL LOW (ref 135–145)
TOTAL PROTEIN: 7.2 g/dL (ref 6.5–8.1)

## 2016-04-23 LAB — GLUCOSE, CAPILLARY
Glucose-Capillary: 121 mg/dL — ABNORMAL HIGH (ref 65–99)
Glucose-Capillary: 153 mg/dL — ABNORMAL HIGH (ref 65–99)
Glucose-Capillary: 149 mg/dL — ABNORMAL HIGH (ref 65–99)

## 2016-04-23 LAB — CK: Total CK: 844 U/L — ABNORMAL HIGH (ref 49–397)

## 2016-04-23 MED ORDER — LORAZEPAM 2 MG/ML IJ SOLN: 2.0000 mg | Freq: Four times a day (QID) | INTRAMUSCULAR | Status: DC | PRN

## 2016-04-23 NOTE — Progress Notes (Signed)
[facility] TEAM 1 - Stepdown/ICU TEAM  Charisse MarchSaul Byrer  ZOX:[id number] DOB: 1981/03/27 DOA: 04/19/2016 PCP: No PCP Per Patient    Brief Narrative:  35 y.o. male with a history of EtOH abuse (has drank heavily since age 35). Patient tried to quit drinking last year at which time he required ICU admission for precedex. He again decided to stop drinking a few days prior to this admit. Within 24-36hrs he began shaking and had at least one seizure per his wife. Following the seizure patient had an episode of vomiting and diarrhea. Patient was confused, hallucinating, and agitated at presentation.   Subjective: The patient is alert and clear.  He is conversant.  He denies any complaints.  He denies tremulousness shortness of breath chest pain nausea vomiting or abdominal pain.  He is anxious to be discharged and would like to get back to work.  We have spoken about his need to discontinue alcohol completely.  Assessment & Plan:  EtOH abuse w/ acute delirium tremens / EtOH withdrawal Cont CIWA protocol but significantly decreased frequency and attempt to discontinue - appears to be much improved today - if stable overnight without significant benzo requirement could potentially be discharged tomorrow  Thrombocytopenia Due to above - resolved with abstinence from alcohol  Hypokalemia  Mg normal - replaced to normal  HTN BP currently well controlled   Rhabdomyolysis  CK continues to improve w/ hydration   Transaminitis  LFTs have normalized - viral hepatitis panel pending  DVT prophylaxis: lovenox Code Status: FULL CODE Family Communication: no family present at time of exam  Disposition Plan: SDU - wean from benzo - potential discharge in a.m.   Consultants:  PCCM  Procedures: none  Antimicrobials:  none  Objective: Blood pressure 96/64, pulse 68, temperature 98.2 F (36.8 C), temperature source Oral, resp. rate 16, height 5\' 6"  (1.676 m), weight 71 kg (156 lb 8.4 oz), SpO2 97  %.  Intake/Output Summary (Last 24 hours) at 04/23/16 1022 Last data filed at 04/23/16 0800  Gross per 24 hour  Intake             2320 ml  Output             4150 ml  Net            -1830 ml   Filed Weights   04/22/16 0500 04/22/16 1600 04/23/16 0308  Weight: 72.2 kg (159 lb 2.8 oz) 71.2 kg (156 lb 15.5 oz) 71 kg (156 lb 8.4 oz)    Examination: General: No acute respiratory distress Lungs: Clear to auscultation bilaterally without wheezes/crackles Cardiovascular: Regular rate and rhythm without murmur gallop or rub normal S1 and S2 Abdomen: Nontender, nondistended, soft, bowel sounds positive, no rebound, no ascites, no mass Extremities: No significant cyanosis, clubbing, edema bilateral lower extremities  CBC:  Recent Labs Lab 04/19/16 0035 04/19/16 1042 04/20/16 0550 04/21/16 0233 04/23/16 0320  WBC 8.2 6.4 10.6* 8.0 6.0  HGB 14.1 13.0 15.2 14.4 14.4  HCT 39.3 36.7* 42.2 41.2 40.5  MCV 101.8* 101.1* 102.7* 101.7* 101.0*  PLT 112* 97* 120* 146* 217   Basic Metabolic Panel:  Recent Labs Lab 04/19/16 0035 04/19/16 0807  04/19/16 2354 04/20/16 0550 04/20/16 1027 04/21/16 0233 04/22/16 0236 04/23/16 0320  NA 136  --   < > 138 136  --  135 135 134*  K 3.7  --   < > 3.5 3.4*  --  3.7 3.0* 4.1  CL 100*  --   < >  108 101  --  102 102 100*  CO2 27  --   < > 26 27  --  GLUCOSE 95  --   < > 127* 127*  --  115* 136* 125*  BUN 6  --   < > <5* <5*  --  <5* <5* <5*  CREATININE 0.71  --   < > 0.59* 0.50*  --  0.72 0.65 0.66  CALCIUM 9.9  --   < > 8.7* 8.7*  --  8.8* 9.2 9.6  MG  --  1.7  --   --   --  1.7 2.1  --   --   PHOS 3.8  --   --   --   --  2.9 3.4  --   --   < > = values in this interval not displayed.   GFR: Estimated Creatinine Clearance: 117.4 mL/min (by C-G formula based on SCr of 0.8 mg/dL).  Liver Function Tests:  Recent Labs Lab 04/19/16 0035 04/20/16 0550 04/22/16 0236 04/23/16 0320  AST 78* 97* 67* 39  ALT 52 66* 49 42  ALKPHOS 54  47 47 46  BILITOT 0.4 1.0 0.6 0.7  PROT 6.8 6.5 6.8 7.2  ALBUMIN 4.3 3.7 3.7 3.7    Recent Labs Lab 04/19/16 1042  LIPASE 57*  AMYLASE 87    Recent Labs Lab 04/19/16 1042  AMMONIA 43*    Coagulation Profile:  Recent Labs Lab 04/19/16 1042  INR 1.18    Cardiac Enzymes:  Recent Labs Lab 04/20/16 0550 04/20/16 0906 04/21/16 0233 04/22/16 0719 04/23/16 0320  CKTOTAL 2,949* 2,540* 2,496* 2,140* 844*  CKMB  --   --   --  5.7*  --     CBG:  Recent Labs Lab 04/22/16 0916 04/22/16 1304 04/22/16 1657 04/22/16 2114 04/23/16 0728  GLUCAP 154* 137* 116* 197* 121*    Recent Results (from the past 240 hour(s))  MRSA PCR Screening     Status: None   Collection Time: 04/19/16 11:35 AM  Result Value Ref Range Status   MRSA by PCR NEGATIVE NEGATIVE Final    Comment:        The GeneXpert MRSA Assay (FDA approved for NASAL specimens only), is one component of a comprehensive MRSA colonization surveillance program. It is not intended to diagnose MRSA infection nor to guide or monitor treatment for MRSA infections.      Scheduled Meds: . cloNIDine  0.3 mg Transdermal Weekly  . enoxaparin (LOVENOX) injection  40 mg Subcutaneous Q24H  . folic acid  1 mg Oral Daily  . multivitamin with minerals  1 tablet Oral Daily  . sodium chloride flush  3 mL Intravenous Q12H  . thiamine  100 mg Oral Daily   Or  . thiamine  100 mg Intravenous Daily     LOS: 4 days   Lonia Blood, MD [facility] Office  [phone] Pager - Text Page per Loretha Stapler as per below:  On-Call/Text Page:      [email or web address]      password [email or web address]  If 7PM-7AM, please contact night-coverage [email or web address] Password [email or web address] 04/23/2016, 10:22 AM

## 2016-04-24 LAB — HEPATITIS PANEL, ACUTE
HEP A IGM: NEGATIVE
HEP B S AG: NEGATIVE
Hep B C IgM: NEGATIVE

## 2016-04-24 NOTE — Progress Notes (Signed)
Pt discharged to home with wife. Pt left with cell phone, charger, and clothing. Case manager sent referrals with patient for rehab options.

## 2016-04-24 NOTE — Discharge Instructions (Signed)
Abstinencia del alcohol (Alcohol Withdrawal) Cuando una persona que bebe mucho alcohol deja de [place]hacerlo, puede sufrir sntomas de abstinencia del alcohol. La abstinencia del alcohol causa problemas. Puede hacer que sienta lo siguiente:  Cansancio (fatiga).  Tristeza (depresin).  Temor (ansiedad).  Malhumor (irritabilidad).  Falta de [place]hambre.  Ganas de vomitar (nuseas).  Temblores. Tambin puede hacer que tenga lo siguiente:  Pesadillas.  Dificultad para dormir.  Dificultad para pensar claramente.  Cambios en el estado de nimo.  Piel hmeda.  Sudoraciones intensas.  Latidos cardacos muy rpidos.  Temblores que no Magazine features editorpuede controlar.  Grant RutsFiebre.  Una crisis de movimientos que no puede controlar (convulsiones).  Confusin.  Vmitos.  Ver o sentir cosas que no existen (alucinaciones). CUIDADOS EN EL HOGAR  Tome los medicamentos y las vitaminas solamente como se lo haya indicado el mdico.  No beba alcohol.  Cuente con alguien cerca por si necesita ayuda.  Beba suficiente lquido para mantener el pis (orina) claro o de color amarillo plido.  Considere la posibilidad de unirse a un grupo que lo ayude a Animal nutritionistdejar de beber. SOLICITE AYUDA SI:  Los problemas empeoran.  Los problemas no desaparecen.  No puede comer o tomar lquido sin vomitar.  Est teniendo mucha dificultad para dejar de beber alcohol.  No puede dejar de beber alcohol. SOLICITE AYUDA DE INMEDIATO SI:  Siente que el corazn late de forma diferente a la habitual.  Le duele el pecho.  Tiene dificultad para respirar.  Tiene problemas graves, por ejemplo:  Grant RutsFiebre.  Neomia DearUna crisis de movimientos que no Magazine features editorpuede controlar.  Mucha confusin.  Ver o sentir cosas que no existen.   Esta informacin no tiene Theme park managercomo fin reemplazar el consejo del mdico. Asegrese de hacerle al mdico cualquier pregunta que tenga.   Document Released: 10/07/2010 Document Revised: 09/25/2014 Elsevier Interactive Patient  Education 2016 [employer]   Trastorno por abuso de alcohol (Alcohol Use Disorder) El trastorno por abuso de alcohol es un trastorno mental. No se trata de un incidente nico en el que se bebe en abundancia. Este trastorno se debe al consumo incontrolable del alcohol a lo largo del [place]iempo, que causa dificultades en una o ms reas de la vida diaria. Al consumir en exceso, las personas que sufren este trastorno estn en riesgo de daarse a s mismos y a Dentistlos dems. Tambin puede causar otros trastornos Frederickmentales, como trastornos del [place] Fallsestado de nimo y trastornos de [place] Parkansiedad, y problemas fsicos graves. Las personas que sufren trastornos por consumo de alcohol, generalmente abusan de otras sustancias.  Los trastornos por consumo de alcohol son frecuentes y se han generalizado. Algunas personas beben alcohol para poder enfrentarse o escapar a las situaciones negativas de la vida. Otros beben para Government social research officeraliviar un dolor crnico o los sntomas de una enfermedad mental. Las personas con historia familiar de trastornos por consumo de alcohol tienen ms riesgo de perder el control y de consumir alcohol en exceso.  Beber mucho alcohol puede ser causa de traumatismos, accidentes y [address]problemas de Beachwoodsalud. Un trago puede ser demasiado si:  Problemas laborales.  Est embarazada o amamantando.  Est tomando medicamentos. Consulte a su mdico.  Est conduciendo o va a hacerlo. SNTOMAS  Los signos y sntomas pueden ser:   Consumo dealcohol engrandes cantidades o durante perodos ms prolongados que lo previsto.  Mltiples intentos fallidos de dejar de bebero controlar el consumo de alcohol.   Emplear gran cantidad de tiempo para obtener, consumir o recuperarse de los efectos del alcohol (resaca).  Un deseo  intenso o necesidad de consumir alcohol (abstinencia).   Continuar consumiendo alcohol a pesar de los [employer], en la escuela o en el hogar debido al consumo.   Continuar consumiendo a  Scientist, water quality de [employer] de relacin debido al consumo.  Continuar consumiendo an en situaciones que impliquen un peligro fsico, como al conducir un vehculo.  Continuar consumiendo a pesar de saber que un problema fsico o psicolgico probablemente se relaciones con el consumo de alcohol. Los problemas fsicos relacionados con el consumo de alcohol pueden producirse en el cerebro, el corazn, el hgado, el estmago y los intestinos. Los problemas psicolgicos relacionados con el consumo de alcohol incluyen intoxicacin, depresin, ansiedad, psicosis, delirio y Photographer.   La necesidad de aumentar la cantidad de alcohol para Wellsite geologist deseado, o una disminucin del efecto al consumir la misma cantidad de alcohol (tolerancia).  Sndrome de abstinencia al reducir o Photographer consumo, o tener que consumir alcohol para reducir o Multimedia programmer sndrome de abstinencia. Los sntomas de la abstinencia incluyen:  Frecuencia cardaca acelerada.  Temblores en las manos.  Dificultad para dormir.  Nuseas.  Vmitos.  Alucinaciones.  Agitacin.  Convulsiones. DIAGNSTICO  El mdico es el encargado de diagnosticar el trastorno por consumo de alcohol. Comenzar hacindole tres o cuatro preguntas para evaluar si consume alcohol en forma excesiva o si representa un problema. Para confirmar el diagnstico de trastorno por consumo de alcohol, deben estar presentes al [employer] un perodo de 12 meses. La gravedad del trastorno depende del nmero de sntomas:   Leve--dos o tres.  Moderado--cuatro o cinco.  Grave--seis o ms. El mdico podr Sports coach un examen fsico o [employer] los [place] de las pruebas de laboratorio para ver si usted tiene problemas fsicos como resultado del consumo de alcohol. Podr derivarlo a Administrator, sports de la salud mental para que realice una evaluacin.  TRATAMIENTO  Algunas personas podrn reducir el consumo a niveles en los que haya un bajo  riesgo. Otras personas deben dejar de beber. Cuando sea necesario, podr recibir ayuda de los profesionales de la salud mental, capacitados en el tratamiento del abuso de sustancias. El mdico podr informarlo sobre la gravedad de su trastorno por consumo de alcohol y decidir qu tipo de tratamiento necesita. Las formas de tratamiento disponibles son:   Desintoxicacin. La desintoxicacin consiste en el uso de medicamentos recetados para evitar el sndrome de abstinencia durante la primera semana en la que se deja de consumir. Esto es importante para las personas con historia de sndrome de abstinencia y para los que consumen en exceso, quienes probablemente sufran el sndrome de abstinencia. El sndrome de abstinencia puede ser peligroso y en algunos casos puede causar la [place]. La desintoxicacin generalmente se realiza internado en un hospital o en una institucin para pacientes que abusan de sustancias.  Asesoramiento psicolgico o psicoterapia. La psicoterapia la realizan terapeutas especializados en el tratamiento de pacientes que abusan de sustancias. Se evalan los motivos que llevan a consumir alcohol, y los modos para Furniture conservator/restorer a consumir. Los [employer] de la psicoterapia son ayudar a que las personas con trastornos por consumo de alcohol encuentren actividades saludables y [employer] de Radio broadcast assistant estrs de la vida, a identificar y Programmer, applications disparadores que originan el consumo de alcohol y a Scientist, physiological abstinencia, que puede originar una recada.  Medicamentos.Hay diferentes medicamentos que pueden ayudar a tratar el trastorno por consumo de alcohol a travs de los siguientes efectos:  Controlan la ansiedad por  consumir.  Disminuyen la respuesta de recompensa positiva que se siente al consumir.  Causan una reaccin fsica desagradable cuando se consume alcohol (terapia por aversin).  Grupos de apoyo. Los grupos de apoyo son dirigidos por personas que han dejado de consumir. Ellos  brindan apoyo emocional, consejo y [place]. Estas formas de tratamiento generalmente se combinan. Algunas personas se benefician con el tratamiento combinado intensivo que se ofrece en algunos centros de tratamiento especializados en el abuso de sustancias. Existen programas para pacientes hospitalizados o ambulatorios.    Esta informacin no tiene Theme park manager el consejo del mdico. Asegrese de hacerle al mdico cualquier pregunta que tenga.   Document Released: 09/04/2005 Document Revised: 09/25/2014 Elsevier Interactive Patient Education [employer]

## 2016-04-24 NOTE — Progress Notes (Signed)
Social worker called per request of patient and patient's wife.

## 2016-04-24 NOTE — Discharge Summary (Signed)
DISCHARGE SUMMARY  Charisse MarchSaul Gulbranson  MR#: [id number]  DOB:1980/12/24  Date of Admission: 04/19/2016 Date of Discharge: 04/24/2016  Attending Physician:Sevilla Murtagh T  Patient's PCP:No PCP Per Patient  Consults:  PCCM  Disposition: D/C home   Follow-up Appts: Follow-up Information    [facility] .   Why:  Call to establish a Primary Care Physician  Contact information: [address] [place] [id number] [phone]          Tests Needing Follow-up: -acute viral hepatitis panel is pending at the time of d/c   Discharge Diagnoses: EtOH abuse w/ acute delirium tremens / EtOH withdrawal Thrombocytopenia Hypokalemia  HTN Rhabdomyolysis  Transaminitis   Initial presentation: 35 y.o.malewith a history of EtOH abuse (has drank heavily since age 35). Patient tried to quit drinking last year at which time he required ICU admission for precedex. He again decided to stop drinking a few days prior to this admit. Within 24-36hrs he began shaking and had at least one seizure per his wife. Following the seizure patient had an episode of vomiting and diarrhea. Patient was confused, hallucinating, and agitated at presentation.   Hospital Course:  EtOH abuse w/ acute delirium tremens / EtOH withdrawal Patient required Precedex initially - he was then weaned to CIWA protocol using Ativan - Ativan was then weaned off and for 24 hours prior to discharge the patient required no benzodiazepine dosing - the patient was alert and oriented, not tremulous, and calm at the time of his discharge - the patient has been counseled on numerous occasions on the absolute need to avoid returning to alcohol use  Thrombocytopenia Due to above - resolved with abstinence from alcohol  Hypokalemia  Mg normal - replaced to normal  HTN BP currently well controlled   Rhabdomyolysis  CK improved w/ hydration   Transaminitis  LFTs normalized -  viral hepatitis panel pending at time of d/c     Medication List    STOP taking these medications   folic acid 1 MG tablet Commonly known as:  FOLVITE   multivitamin with minerals Tabs tablet   thiamine 100 MG tablet       Day of Discharge BP 131/82 (BP Location: Left Arm)   Pulse 73   Temp 98.5 F (36.9 C) (Oral)   Resp 20   Ht 5\' 6"  (1.676 m)   Wt 71 kg (156 lb 8.4 oz)   SpO2 100%   BMI 25.26 kg/m   Physical Exam: General: No acute respiratory distress Lungs: Clear to auscultation bilaterally without wheezes or crackles Cardiovascular: Regular rate and rhythm without murmur gallop or rub normal S1 and S2 Abdomen: Nontender, nondistended, soft, bowel sounds positive, no rebound, no ascites, no appreciable mass Extremities: No significant cyanosis, clubbing, or edema bilateral lower extremities  Basic Metabolic Panel:  Recent Labs Lab 04/19/16 0035 04/19/16 0807  04/19/16 2354 04/20/16 0550 04/20/16 1027 04/21/16 0233 04/22/16 0236 04/23/16 0320  NA 136  --   < > 138 136  --  135 135 134*  K 3.7  --   < > 3.5 3.4*  --  3.7 3.0* 4.1  CL 100*  --   < > 108 101  --  102 102 100*  CO2 27  --   < > 26 27  --  27 23 27   GLUCOSE 95  --   < > 127* 127*  --  115* 136* 125*  BUN 6  --   < > <  5* <5*  --  <5* <5* <5*  CREATININE 0.71  --   < > 0.59* 0.50*  --  0.72 0.65 0.66  CALCIUM 9.9  --   < > 8.7* 8.7*  --  8.8* 9.2 9.6  MG  --  1.7  --   --   --  1.7 2.1  --   --   PHOS 3.8  --   --   --   --  2.9 3.4  --   --   < > = values in this interval not displayed.  Liver Function Tests:  Recent Labs Lab 04/19/16 0035 04/20/16 0550 04/22/16 0236 04/23/16 0320  AST 78* 97* 67* 39  ALT 52 66* 49 42  ALKPHOS 54 47 47 46  BILITOT 0.4 1.0 0.6 0.7  PROT 6.8 6.5 6.8 7.2  ALBUMIN 4.3 3.7 3.7 3.7    Recent Labs Lab 04/19/16 1042  LIPASE 57*  AMYLASE 87    Recent Labs Lab 04/19/16 1042  AMMONIA 43*   Coags:  Recent Labs Lab 04/19/16 1042  INR 1.18    CBC:  Recent Labs Lab 04/19/16 0035 04/19/16 1042 04/20/16 0550 04/21/16 0233 04/23/16 0320  WBC 8.2 6.4 10.6* 8.0 6.0  HGB 14.1 13.0 15.2 14.4 14.4  HCT 39.3 36.7* 42.2 41.2 40.5  MCV 101.8* 101.1* 102.7* 101.7* 101.0*  PLT 112* 97* 120* 146* 217   Cardiac Enzymes:  Recent Labs Lab 04/20/16 0550 04/20/16 0906 04/21/16 0233 04/22/16 0719 04/23/16 0320  CKTOTAL 2,949* 2,540* 2,496* 2,140* 844*  CKMB  --   --   --  5.7*  --     CBG:  Recent Labs Lab 04/22/16 1657 04/22/16 2114 04/23/16 0728 04/23/16 1159 04/23/16 1603  GLUCAP 116* 197* 121* 153* 149*    Recent Results (from the past 240 hour(s))  MRSA PCR Screening     Status: None   Collection Time: 04/19/16 11:35 AM  Result Value Ref Range Status   MRSA by PCR NEGATIVE NEGATIVE Final    Comment:        The GeneXpert MRSA Assay (FDA approved for NASAL specimens only), is one component of a comprehensive MRSA colonization surveillance program. It is not intended to diagnose MRSA infection nor to guide or monitor treatment for MRSA infections.      Time spent in discharge (includes decision making & examination of pt): 30 minutes  04/24/2016, 11:50 AM   Lonia Blood, MD [facility] Office  [phone] Pager [phone]  On-Call/Text Page:      [email or web address]      password [facility]

## 2016-04-24 NOTE — Progress Notes (Signed)
CSW received consult regarding ETOH resources.  Patient's wife stated that she would like to get the patient into a residential substance use treatment facility, but pt has been unwilling to get help. Every time he tries to stop drinking, she states he gets agitated and that is why he is unable to stop. Previous to this hospital visit, she drove pt [facility] but pt would not stay. CSW provided resources and circled the places in [facility] close to where they live that accept patients without insurance. CSW also provided emergency services resources.   CSW signing off.  Osborne Cascoadia Flo Berroa LCSWA [phone]

## 2019-01-23 ENCOUNTER — Emergency Department (HOSPITAL_COMMUNITY): Payer: Self-pay

## 2019-01-23 ENCOUNTER — Encounter (HOSPITAL_COMMUNITY): Payer: Self-pay | Admitting: Emergency Medicine

## 2019-01-23 ENCOUNTER — Emergency Department (HOSPITAL_COMMUNITY)
Admission: EM | Admit: 2019-01-23 | Discharge: 2019-01-23 | Disposition: A | Discharge status: Home / Self Care | Payer: Self-pay | Attending: Emergency Medicine | Admitting: Emergency Medicine

## 2019-01-23 DIAGNOSIS — F1010 Alcohol abuse, uncomplicated: Secondary | ICD-10-CM | POA: Insufficient documentation

## 2019-01-23 DIAGNOSIS — S0990XA Unspecified injury of head, initial encounter: Secondary | ICD-10-CM | POA: Insufficient documentation

## 2019-01-23 DIAGNOSIS — R55 Syncope and collapse: Secondary | ICD-10-CM | POA: Insufficient documentation

## 2019-01-23 DIAGNOSIS — I10 Essential (primary) hypertension: Secondary | ICD-10-CM | POA: Insufficient documentation

## 2019-01-23 DIAGNOSIS — Z1159 Encounter for screening for other viral diseases: Secondary | ICD-10-CM | POA: Insufficient documentation

## 2019-01-23 DIAGNOSIS — Z87891 Personal history of nicotine dependence: Secondary | ICD-10-CM | POA: Insufficient documentation

## 2019-01-23 DIAGNOSIS — Y999 Unspecified external cause status: Secondary | ICD-10-CM | POA: Insufficient documentation

## 2019-01-23 DIAGNOSIS — Y92410 Unspecified street and highway as the place of occurrence of the external cause: Secondary | ICD-10-CM | POA: Insufficient documentation

## 2019-01-23 DIAGNOSIS — Y9389 Activity, other specified: Secondary | ICD-10-CM | POA: Insufficient documentation

## 2019-01-23 HISTORY — DX: Unspecified convulsions: R56.9

## 2019-01-23 LAB — CBC WITH DIFFERENTIAL/PLATELET
Abs Immature Granulocytes: 0.05 10*3/uL (ref 0.00–0.07)
Basophils Absolute: 0.1 10*3/uL (ref 0.0–0.1)
Basophils Relative: 1 %
Eosinophils Absolute: 0 10*3/uL (ref 0.0–0.5)
Eosinophils Relative: 0 %
HCT: 41.1 % (ref 39.0–52.0)
Hemoglobin: 14.8 g/dL (ref 13.0–17.0)
Immature Granulocytes: 1 %
Lymphocytes Relative: 4 %
Lymphs Abs: 0.3 10*3/uL — ABNORMAL LOW (ref 0.7–4.0)
MCH: 35.7 pg — ABNORMAL HIGH (ref 26.0–34.0)
MCHC: 36 g/dL (ref 30.0–36.0)
MCV: 99 fL (ref 80.0–100.0)
Monocytes Absolute: 0.7 10*3/uL (ref 0.1–1.0)
Monocytes Relative: 9 %
Neutro Abs: 6.5 10*3/uL (ref 1.7–7.7)
Neutrophils Relative %: 85 %
Platelets: 64 10*3/uL — ABNORMAL LOW (ref 150–400)
RBC: 4.15 MIL/uL — ABNORMAL LOW (ref 4.22–5.81)
RDW: 13.1 % (ref 11.5–15.5)
WBC: 7.6 10*3/uL (ref 4.0–10.5)
nRBC: 0 % (ref 0.0–0.2)

## 2019-01-23 LAB — ETHANOL: Alcohol, Ethyl (B): 57 mg/dL — ABNORMAL HIGH (ref <10)

## 2019-01-23 LAB — SARS CORONAVIRUS 2 BY RT PCR (HOSPITAL ORDER, PERFORMED IN ~~LOC~~ HOSPITAL LAB): SARS Coronavirus 2: NEGATIVE

## 2019-01-23 LAB — TSH: TSH: 0.52 u[IU]/mL (ref 0.350–4.500)

## 2019-01-23 LAB — COMPREHENSIVE METABOLIC PANEL
ALT: 66 U/L — ABNORMAL HIGH (ref 0–44)
AST: 101 U/L — ABNORMAL HIGH (ref 15–41)
Albumin: 3.9 g/dL (ref 3.5–5.0)
Alkaline Phosphatase: 89 U/L (ref 38–126)
Anion gap: 12 (ref 5–15)
BUN: <5 mg/dL — ABNORMAL LOW (ref 6–20)
CO2: 27 mmol/L (ref 22–32)
Calcium: 8.9 mg/dL (ref 8.9–10.3)
Chloride: 99 mmol/L (ref 98–111)
Creatinine, Ser: 0.65 mg/dL (ref 0.61–1.24)
GFR calc Af Amer: >60 mL/min (ref >60)
GFR calc non Af Amer: >60 mL/min (ref >60)
Glucose, Bld: 111 mg/dL — ABNORMAL HIGH (ref 70–99)
Potassium: 3.8 mmol/L (ref 3.5–5.1)
Sodium: 138 mmol/L (ref 135–145)
Total Bilirubin: 1 mg/dL (ref 0.3–1.2)
Total Protein: 7 g/dL (ref 6.5–8.1)

## 2019-01-23 LAB — TROPONIN I: Troponin I: <0.03 ng/mL (ref <0.03)

## 2019-01-23 MED ORDER — LORAZEPAM 2 MG/ML IJ SOLN
2.0000 mg | Freq: Once | INTRAMUSCULAR | Status: AC
  Administered 2019-01-23: 16:00:00 2 mg via INTRAVENOUS
  Filled 2019-01-23: qty 1

## 2019-01-23 MED ORDER — SODIUM CHLORIDE 0.9 % IV BOLUS
500.0000 mL | Freq: Once | INTRAVENOUS | Status: AC
  Administered 2019-01-23: 500 mL via INTRAVENOUS
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
## 2019-01-23 NOTE — ED Notes (Signed)
  Patient sent home with taxi voucher
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
## 2019-01-23 NOTE — ED Notes (Signed)
Labs hemolyzed reordered and drawn
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
## 2019-01-23 NOTE — Discharge Instructions (Addendum)
Usted ha sido visto hoy en el Departamento de Associate Professor (DE) por sncope (desmayo). Su evaluacin, incluidos los laboratorios y el ECG, Pastos tranquilizadores. Sus sntomas pueden deberse a Scientist, research (medical), por lo que es importante que tome muchos lquidos no alcohlicos.  No conduzca un automvil hasta que su mdico de atencin primaria lo autorice.  Llame a su mdico habitual lo antes posible para programar la prxima cita clnica disponible para hacer un seguimiento con l / ella con respecto a su visita al servicio de urgencias y sus sntomas. Regrese al Departamento de Emergencia (DE) si tiene ms episodios sincopales (se desmaya) o desarrolla CUALQUIER dolor de pecho, presin, opresin, dificultad para respirar, sudoracin repentina u otros sntomas que le preocupan.
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
## 2019-01-23 NOTE — ED Notes (Signed)
Told dr that he stopped taking his sz meds
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
